# Patient Record
Sex: Female | Born: 1995 | Race: Black or African American | Hispanic: No | Marital: Single
Health system: Midwestern US, Community
[De-identification: ages and names within clinical notes are randomized; demographics above are authoritative.]

## PROBLEM LIST (undated history)

## (undated) ENCOUNTER — Emergency Department (HOSPITAL_COMMUNITY): Payer: Medicaid Other

## (undated) DIAGNOSIS — M25561 Pain in right knee: Principal | ICD-10-CM

## (undated) DIAGNOSIS — F419 Anxiety disorder, unspecified: Secondary | ICD-10-CM

## (undated) DIAGNOSIS — N6459 Other signs and symptoms in breast: Secondary | ICD-10-CM

## (undated) DIAGNOSIS — T753XXA Motion sickness, initial encounter: Secondary | ICD-10-CM

## (undated) DIAGNOSIS — D352 Benign neoplasm of pituitary gland: Secondary | ICD-10-CM

## (undated) HISTORY — PX: ANKLE SURGERY: SHX546

## (undated) HISTORY — DX: Anxiety disorder, unspecified: F41.9

---
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
## 2016-03-12 ENCOUNTER — Other Ambulatory Visit: Payer: Self-pay | Admitting: Specialist

## 2016-03-12 DIAGNOSIS — M25561 Pain in right knee: Secondary | ICD-10-CM

## 2016-03-31 ENCOUNTER — Ambulatory Visit
Admission: RE | Admit: 2016-03-31 | Discharge: 2016-03-31 | Disposition: A | Discharge status: Home / Self Care | Payer: Medicaid Other | Source: Ambulatory Visit | Attending: Specialist | Admitting: Specialist

## 2016-03-31 DIAGNOSIS — M25561 Pain in right knee: Secondary | ICD-10-CM | POA: Diagnosis not present

## 2016-06-26 ENCOUNTER — Emergency Department: Payer: Medicaid Other

## 2016-06-26 ENCOUNTER — Other Ambulatory Visit: Payer: Self-pay

## 2016-06-26 ENCOUNTER — Emergency Department
Admission: EM | Admit: 2016-06-26 | Discharge: 2016-06-26 | Disposition: A | Discharge status: Home / Self Care | Payer: Medicaid Other | Attending: Emergency Medicine | Admitting: Emergency Medicine

## 2016-06-26 DIAGNOSIS — R072 Precordial pain: Secondary | ICD-10-CM | POA: Diagnosis not present

## 2016-06-26 DIAGNOSIS — R079 Chest pain, unspecified: Secondary | ICD-10-CM

## 2016-06-26 LAB — BASIC METABOLIC PANEL
Anion gap: 10 (ref 5–15)
BUN: 9 mg/dL (ref 6–20)
CHLORIDE: 105 mmol/L (ref 101–111)
CO2: 24 mmol/L (ref 22–32)
CREATININE: 0.74 mg/dL (ref 0.44–1.00)
Calcium: 10.1 mg/dL (ref 8.9–10.3)
Glucose, Bld: 87 mg/dL (ref 65–99)
POTASSIUM: 3.5 mmol/L (ref 3.5–5.1)
SODIUM: 139 mmol/L (ref 135–145)

## 2016-06-26 LAB — CBC
HCT: 37.6 % (ref 35.0–47.0)
Hemoglobin: 13.3 g/dL (ref 12.0–16.0)
MCH: 30.3 pg (ref 26.0–34.0)
MCHC: 35.5 g/dL (ref 32.0–36.0)
MCV: 85.3 fL (ref 80.0–100.0)
PLATELETS: 245 10*3/uL (ref 150–440)
RBC: 4.41 MIL/uL (ref 3.80–5.20)
RDW: 12.8 % (ref 11.5–14.5)
WBC: 4.3 10*3/uL (ref 3.6–11.0)

## 2016-06-26 LAB — FIBRIN DERIVATIVES D-DIMER (ARMC ONLY): FIBRIN DERIVATIVES D-DIMER (ARMC): 368 (ref 0–499)

## 2016-06-26 LAB — TROPONIN I: Troponin I: <0.03 ng/mL (ref <0.03)

## 2016-06-26 LAB — POCT PREGNANCY, URINE: PREG TEST UR: NEGATIVE

## 2016-06-26 NOTE — ED Triage Notes (Addendum)
Pt c/o sharp pain in the substernal chest that started last night, worse with deep breathing.. Denies injury or other sx..pt states she just started 3 new medication yesterday, abx for bacterial vaginosis, antidepressant, nausea meds.Ana Moran

## 2016-06-26 NOTE — ED Provider Notes (Signed)
[facility] Emergency Department Provider Note ____________________________________________  Time seen: Approximately 1pm I have reviewed the triage vital signs and the triage nursing note.  HISTORY  Chief Complaint Chest Pain   Historian Patient  HPI Ana Moran is a 20 y.o. female who is here for evaluation of chest pain that started last night, a central lesion located and is a little bit worse with breathing. Somewhat waxing and waning. No fever. No coughing. No sinus congestion or drainage. No abdominal pain, or vomiting. She does have some nausea but she takes nausea medication for this because it is a side effect of her depression medication. She reports she does struggle with anxiety, but does not feel like she had a specific panic attack. She does report being under some stress recently.  She denies shortness of breath, but does state that the chest pain is a little bit worse with deep inspiration. She is on birth control pills. Reports family history of blood clot, in family members with cancer. No additional risk factors for DVT or PE. No leg swelling or calf tenderness.    History reviewed. No pertinent past medical history.  There are no active problems to display for this patient.   Past Surgical History:  Procedure Laterality Date  . KNEE ARTHROSCOPY WITH MENISCAL REPAIR Left 11/14/2015   Procedure: KNEE ARTHROSCOPY WITH partial synovectomy;  Surgeon: Corky Mull, MD;  Location: [facility] ORS;  Service: Orthopedics;  Laterality: Left;    Prior to Admission medications   Medication Sig Start Date End Date Taking? Authorizing Provider  Cholecalciferol (VITAMIN D-3 PO) Take by mouth.    Historical Provider, MD  HYDROcodone-acetaminophen (NORCO) 5-325 MG tablet Take 1-2 tablets by mouth every 6 (six) hours as needed for moderate pain. MAXIMUM TOTAL ACETAMINOPHEN DOSE IS 4000 MG PER DAY 11/14/15   Corky Mull, MD  ibuprofen (ADVIL,MOTRIN) 100  MG tablet Take 6-8 tablets (600-800 mg total) by mouth every 6 (six) hours as needed for pain or fever. 11/14/15   Corky Mull, MD  ondansetron (ZOFRAN-ODT) 4 MG disintegrating tablet Take 1 tablet (4 mg total) by mouth every 8 (eight) hours as needed for nausea or vomiting. 11/30/15   Anne-Caroline Mariea Clonts, MD  PARoxetine (PAXIL) 10 MG tablet Take 10 mg by mouth daily.    Historical Provider, MD    Allergies  Allergen Reactions  . Strawberry (Diagnostic)     No family history on file.  Social History Social History  Substance Use Topics  . Smoking status: Never Smoker  . Smokeless tobacco: Never Used  . Alcohol use No    Review of Systems   Constitutional: Negative for fever. Eyes: Reports occasional specks floating in her vision. Not now. ENT: Negative for sore throat.  Cardiovascular: Negative for palpitations. Respiratory: Negative for shortness of breath. Gastrointestinal: Negative for abdominal pain, vomiting and diarrhea. Genitourinary: Negative for dysuria. Musculoskeletal: Negative for back pain. Skin: Negative for rash. Neurological: Negative for headache. 10 point Review of Systems otherwise negative ____________________________________________   PHYSICAL EXAM:  VITAL SIGNS: ED Triage Vitals  Enc Vitals Group     BP 06/26/16 1147 134/76     Pulse Rate 06/26/16 1147 87     Resp 06/26/16 1147 16     Temp 06/26/16 1147 98.6 F (37 C)     Temp Source 06/26/16 1147 Oral     SpO2 06/26/16 1147 100 %     Weight 06/26/16 1147 127 lb (57.6 kg)  Height 06/26/16 1147 5\' 4"  (1.626 m)     Head Circumference --      Peak Flow --      Pain Score 06/26/16 1149 9     Pain Loc --      Pain Edu? --      Excl. in [place]? --      Constitutional: Alert and oriented. Well appearing and in no distress. HEENT   Head: Normocephalic and atraumatic.      Eyes: Conjunctivae are normal. PERRL. Normal extraocular movements.  Funduscopic exam normal bilaterally.      Ears:          Nose: No congestion/rhinnorhea.   Mouth/Throat: Mucous membranes are moist.   Neck: No stridor. Cardiovascular/Chest: Normal rate, regular rhythm.  No murmurs, rubs, or gallops. Respiratory: Normal respiratory effort without tachypnea nor retractions. Breath sounds are clear and equal bilaterally. No wheezes/rales/rhonchi. Gastrointestinal: Soft. No distention, no guarding, no rebound. Nontender.    Genitourinary/rectal:Deferred Musculoskeletal: Nontender with normal range of motion in all extremities. No joint effusions.  No lower extremity tenderness.  No edema. Neurologic:  Normal speech and language. No gross or focal neurologic deficits are appreciated. Skin:  Skin is warm, dry and intact. No rash noted. Psychiatric: Mood and affect are normal. Speech and behavior are normal. Patient exhibits appropriate insight and judgment.  ____________________________________________   EKG I, Lisa Roca, MD, the attending physician have personally viewed and interpreted all ECGs.  104 bpm. Sinus tachycardia. Narrow QRS. Nonspecific ST and T-wave. QTc 470. ____________________________________________  LABS (pertinent positives/negatives)  Labs Reviewed  BASIC METABOLIC PANEL  CBC  TROPONIN I  FIBRIN DERIVATIVES D-DIMER ([facility] ONLY)  POCT PREGNANCY, URINE    ____________________________________________  RADIOLOGY All Xrays were viewed by me. Imaging interpreted by Radiologist.  Chest two-view: Negative two-view chest x-ray __________________________________________  PROCEDURES  Procedure(s) performed: None  Critical Care performed: None  ____________________________________________   ED COURSE / ASSESSMENT AND PLAN  Pertinent labs & imaging results that were available during my care of the patient were reviewed by me and considered in my medical decision making (see chart for details).   This young healthy patient's symptoms are nonspecific, and I think that  with normal exam, 100% oxygenation, and reassuring laboratory studies and x-ray, I am most suspicious that her symptoms may be stress related.  However, in terms of possibility for PE, she did indicate some pleuritic nature to the chest discomfort, and she is on birth control pills, and she had her initial EKG showed heart rate of 104, and so I discussed with her obtaining a d-dimer for further stratification.    Again, I have extremely low suspicion for PE clinically by her description of symptoms, and D-dimer is below threshold.  I have discussed return precautions with patient and mom.    CONSULTATIONS:   None   Patient / Family / Caregiver informed of clinical course, medical decision-making process, and agree with plan.   I discussed return precautions, follow-up instructions, and discharged instructions with patient and/or family.   ___________________________________________   FINAL CLINICAL IMPRESSION(S) / ED DIAGNOSES   Final diagnoses:  Nonspecific chest pain              Note: This dictation was prepared with Dragon dictation. Any transcriptional errors that result from this process are unintentional    Lisa Roca, MD 06/26/16 1454

## 2016-06-26 NOTE — Discharge Instructions (Signed)
You were evaluated for chest discomfort, and although no certain cause was found, your exam and evaluation are reassuring in the emergency department today. Return to emergency department for any worsening condition including trouble breathing, sweating, dizziness, passing out, palpitation, fever, or any other symptoms concerning to you. Please follow-up with a primary care doctor.

## 2016-10-12 ENCOUNTER — Other Ambulatory Visit: Payer: Self-pay | Admitting: Family Medicine

## 2016-10-12 DIAGNOSIS — N644 Mastodynia: Secondary | ICD-10-CM

## 2016-10-29 ENCOUNTER — Ambulatory Visit
Admission: RE | Admit: 2016-10-29 | Discharge: 2016-10-29 | Disposition: A | Discharge status: Home / Self Care | Payer: Medicaid Other | Source: Ambulatory Visit | Attending: Family Medicine | Admitting: Family Medicine

## 2016-10-29 DIAGNOSIS — N644 Mastodynia: Secondary | ICD-10-CM

## 2016-10-29 HISTORY — DX: Other signs and symptoms in breast: N64.59

## 2016-12-22 IMAGING — MR MR KNEE*R* W/O CM
6 series · 38 of 40 positions shown · non-contrast
Comparison: None.

CLINICAL DATA: Right knee pain and swelling, chronic. No known
injury. Initial encounter.

EXAM:
MRI OF THE RIGHT KNEE WITHOUT CONTRAST
TECHNIQUE: Multiplanar, multisequence MR imaging of the knee was performed. No
intravenous contrast was administered.

[Series 3: PD fat-sat · axial · 3.0mm · 0.50mm/px · z∈[-67,+45]mm · 8 of 35 slices shown (1 of 4)]
[im 1/35]
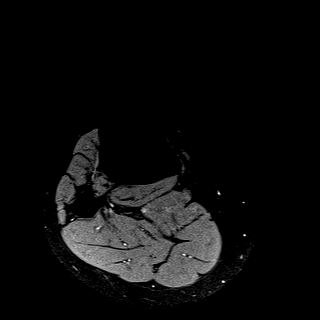
[im 5/35]
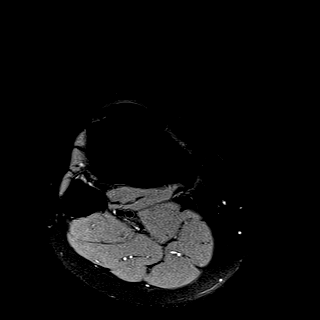
[im 10/35]
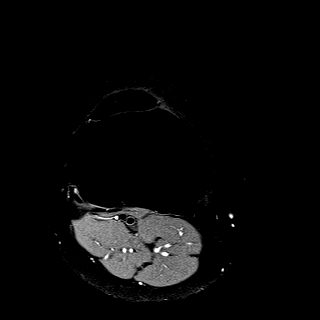
[im 15/35]
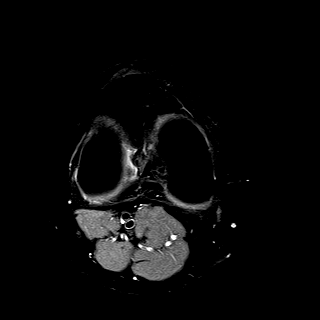
[im 20/35]
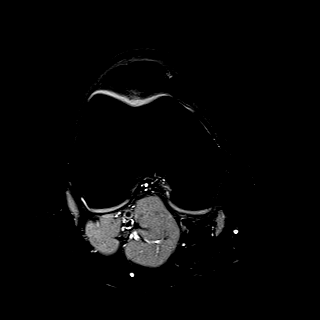
[im 25/35]
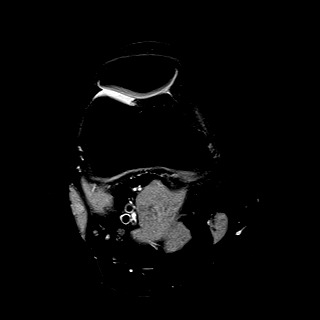
[im 30/35]
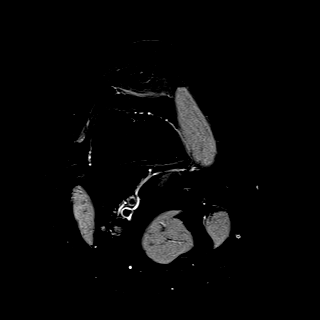
[im 35/35]
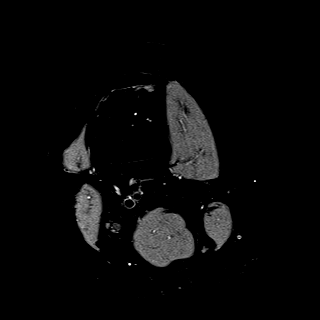

[Series 4: T1 · coronal · 3.0mm · 0.50mm/px · 5 of 29 slices shown]
[im 1/29]
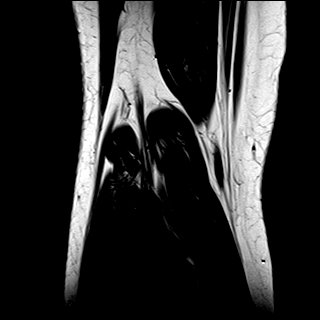
[im 5/29]
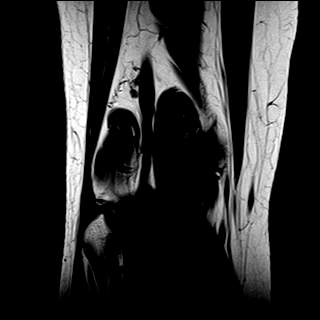
[im 10/29]
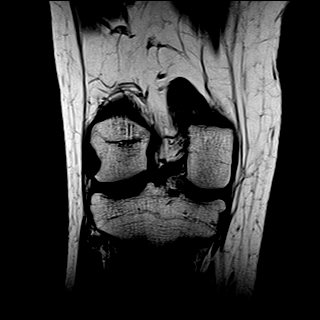
[im 15/29]
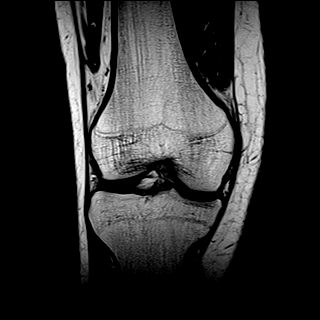
[im 19/29]
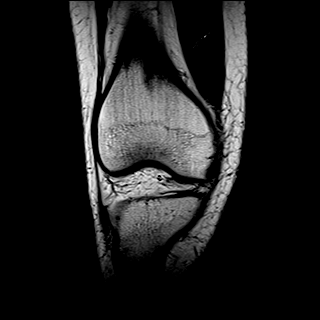

[Series 5: PD fat-sat · sagittal · 3.0mm · 0.50mm/px · 8 of 31 slices shown (2 of 4)]
[im 1/31]
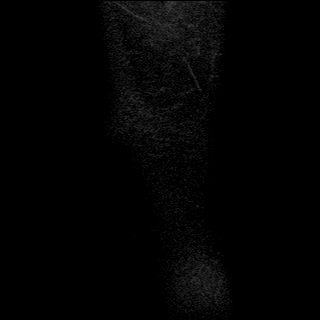
[im 5/31]
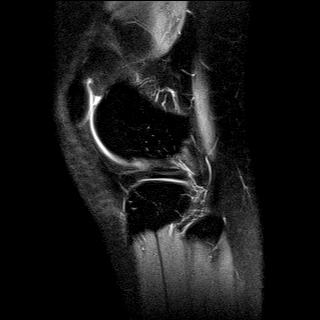
[im 9/31]
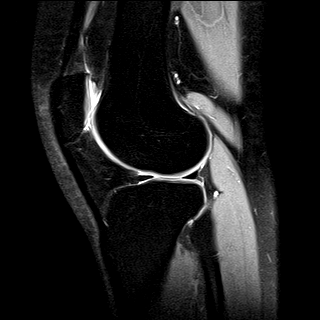
[im 13/31]
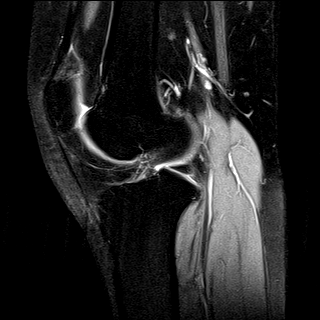
[im 18/31]
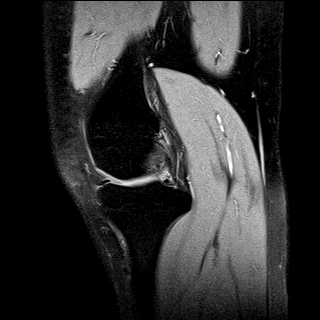
[im 22/31]
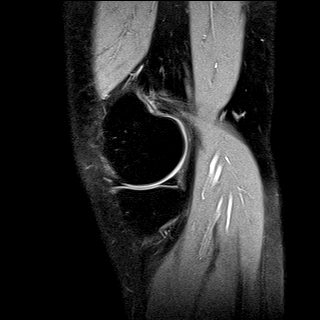
[im 26/31]
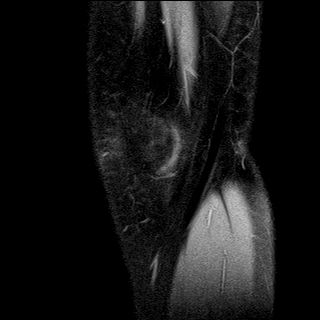
[im 31/31]
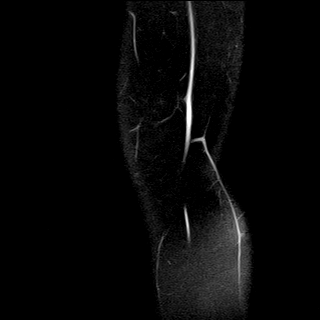

[Series 6: T2 fat-sat · coronal · 3.0mm · 0.31mm/px · 7 of 29 slices shown]
[im 1/29]
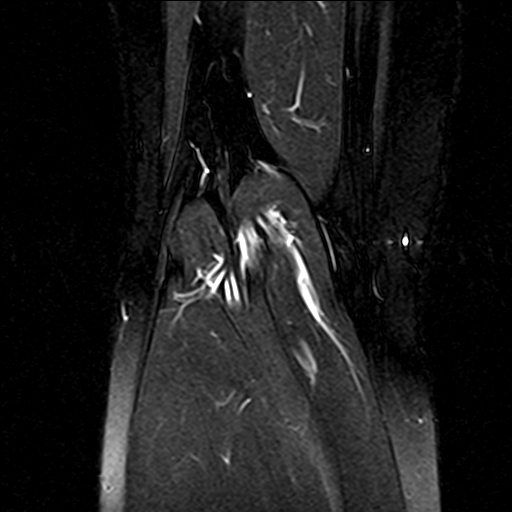
[im 5/29]
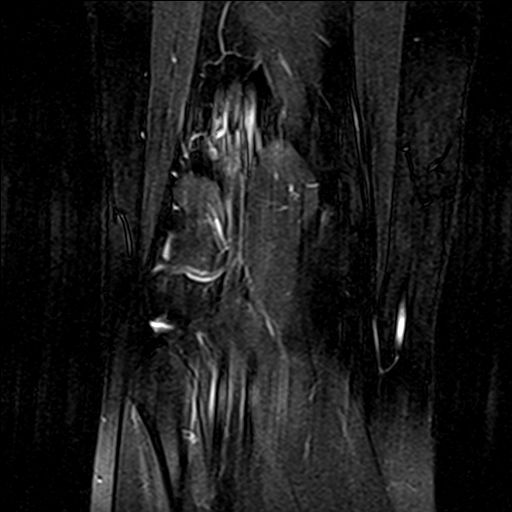
[im 10/29]
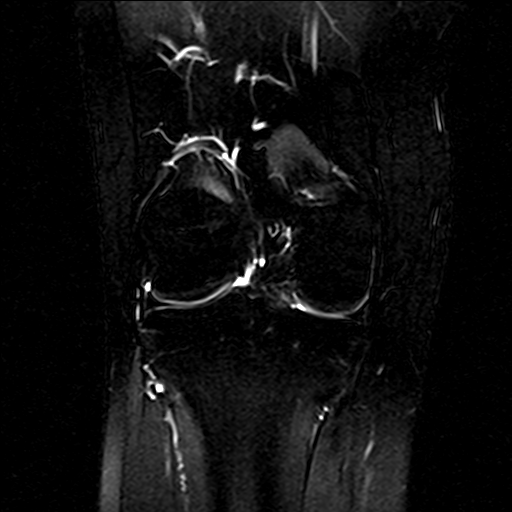
[im 15/29]
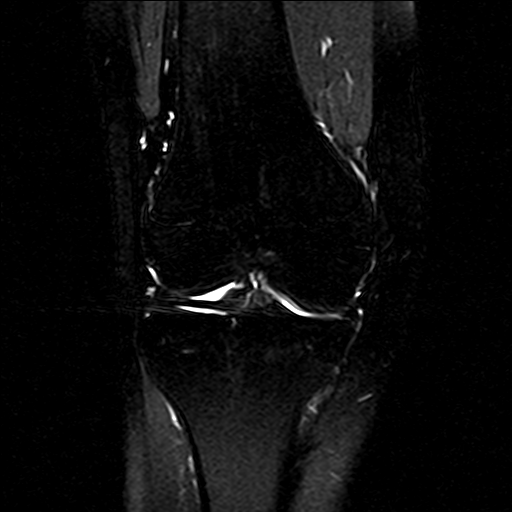
[im 19/29]
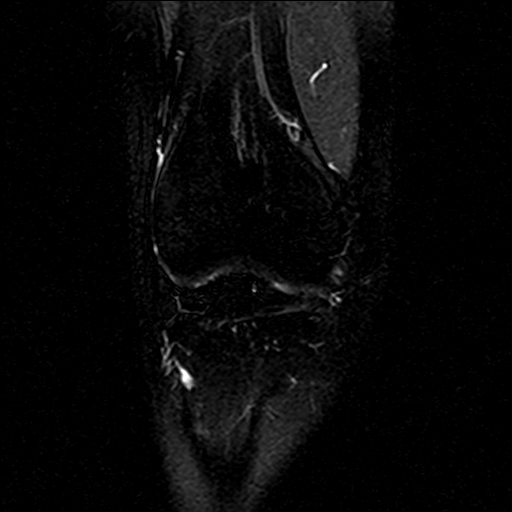
[im 24/29]
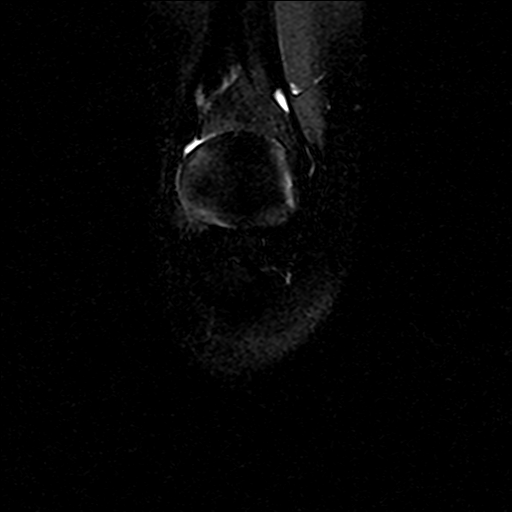
[im 29/29]
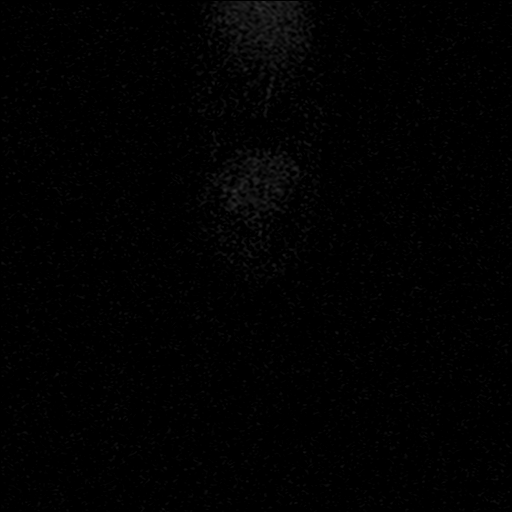

[Series 7: PD fat-sat · coronal · 3.0mm · 0.50mm/px · 7 of 29 slices shown (3 of 4)]
[im 1/29]
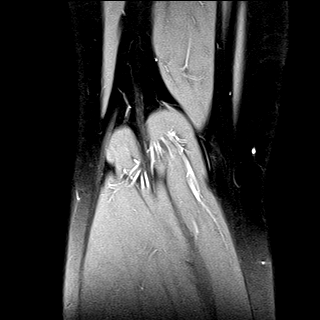
[im 5/29]
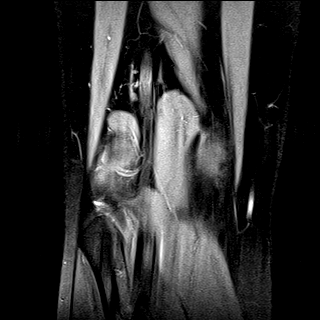
[im 10/29]
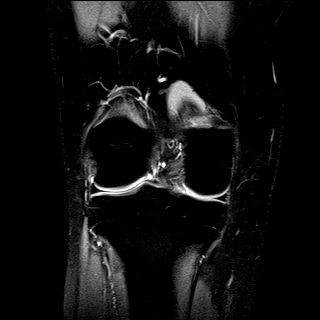
[im 15/29]
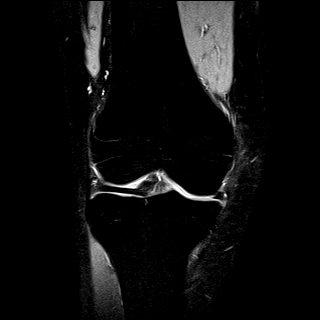
[im 19/29]
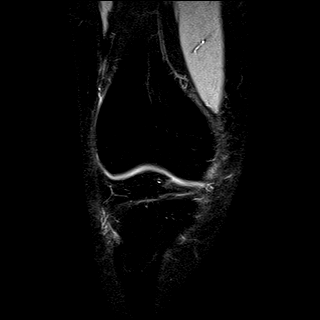
[im 24/29]
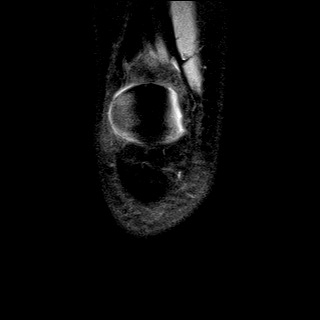
[im 29/29]
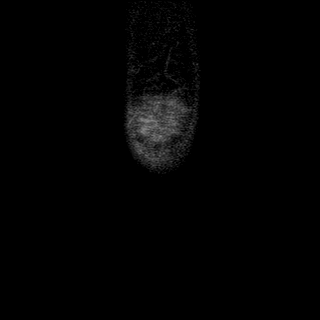

[Series 8: PD fat-sat · oblique · 2.0mm · 0.62mm/px · 3 of 11 slices shown (4 of 4)]
[im 1/11]
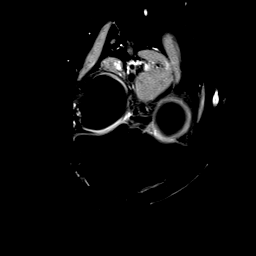
[im 6/11]
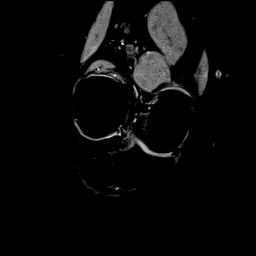
[im 11/11]
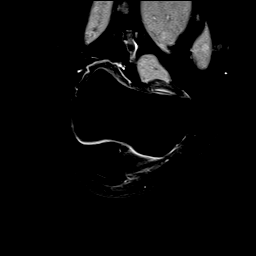

[38 of 40 positions shown; findings below may reference images not displayed]

FINDINGS: MENISCI

Medial meniscus:  Intact.

Lateral meniscus:  Intact.

LIGAMENTS

Cruciates:  Intact.

Collaterals:  Intact.

CARTILAGE

Patellofemoral:  Unremarkable.

Medial:  Unremarkable.

Lateral:  Unremarkable.

Joint:  No effusion.

Popliteal Fossa:  No Baker's cyst.

Extensor Mechanism:  Intact.

Bones:  Normal marrow signal throughout.
IMPRESSION: Normal MRI right knee.

## 2017-03-23 ENCOUNTER — Other Ambulatory Visit (HOSPITAL_COMMUNITY): Payer: Self-pay | Admitting: Physician Assistant

## 2017-03-23 ENCOUNTER — Other Ambulatory Visit: Payer: Self-pay | Admitting: Physician Assistant

## 2017-03-23 DIAGNOSIS — M25561 Pain in right knee: Secondary | ICD-10-CM

## 2017-04-05 ENCOUNTER — Ambulatory Visit
Admission: RE | Admit: 2017-04-05 | Discharge: 2017-04-05 | Disposition: A | Discharge status: Home / Self Care | Payer: Medicaid Other | Source: Ambulatory Visit | Attending: Physician Assistant | Admitting: Physician Assistant

## 2017-04-05 DIAGNOSIS — M25561 Pain in right knee: Secondary | ICD-10-CM | POA: Diagnosis present

## 2017-10-01 ENCOUNTER — Other Ambulatory Visit: Payer: Self-pay | Admitting: Sports Medicine

## 2017-10-01 DIAGNOSIS — G8929 Other chronic pain: Secondary | ICD-10-CM

## 2017-10-01 DIAGNOSIS — M25572 Pain in left ankle and joints of left foot: Secondary | ICD-10-CM

## 2017-10-01 DIAGNOSIS — M67472 Ganglion, left ankle and foot: Secondary | ICD-10-CM

## 2017-10-09 ENCOUNTER — Ambulatory Visit
Admission: RE | Admit: 2017-10-09 | Discharge: 2017-10-09 | Disposition: A | Discharge status: Home / Self Care | Payer: Medicaid Other | Source: Ambulatory Visit | Attending: Sports Medicine | Admitting: Sports Medicine

## 2017-10-09 DIAGNOSIS — G8929 Other chronic pain: Secondary | ICD-10-CM | POA: Diagnosis not present

## 2017-10-09 DIAGNOSIS — M25572 Pain in left ankle and joints of left foot: Secondary | ICD-10-CM | POA: Diagnosis not present

## 2017-10-09 DIAGNOSIS — M899 Disorder of bone, unspecified: Secondary | ICD-10-CM | POA: Insufficient documentation

## 2017-10-09 DIAGNOSIS — M659 Synovitis and tenosynovitis, unspecified: Secondary | ICD-10-CM | POA: Insufficient documentation

## 2017-10-09 DIAGNOSIS — M67472 Ganglion, left ankle and foot: Secondary | ICD-10-CM | POA: Diagnosis present

## 2017-10-23 ENCOUNTER — Encounter: Payer: Self-pay | Admitting: Emergency Medicine

## 2017-10-23 ENCOUNTER — Emergency Department
Admission: EM | Admit: 2017-10-23 | Discharge: 2017-10-23 | Disposition: A | Discharge status: Home / Self Care | Payer: Medicaid Other | Attending: Emergency Medicine | Admitting: Emergency Medicine

## 2017-10-23 ENCOUNTER — Emergency Department: Payer: Medicaid Other

## 2017-10-23 ENCOUNTER — Other Ambulatory Visit: Payer: Self-pay

## 2017-10-23 DIAGNOSIS — R0789 Other chest pain: Secondary | ICD-10-CM | POA: Diagnosis not present

## 2017-10-23 DIAGNOSIS — Z79899 Other long term (current) drug therapy: Secondary | ICD-10-CM | POA: Diagnosis not present

## 2017-10-23 DIAGNOSIS — R079 Chest pain, unspecified: Secondary | ICD-10-CM | POA: Diagnosis present

## 2017-10-23 LAB — BASIC METABOLIC PANEL
Anion gap: 9 (ref 5–15)
BUN: 14 mg/dL (ref 6–20)
CHLORIDE: 102 mmol/L (ref 101–111)
CO2: 26 mmol/L (ref 22–32)
Calcium: 9.3 mg/dL (ref 8.9–10.3)
Creatinine, Ser: 0.8 mg/dL (ref 0.44–1.00)
GFR calc Af Amer: >60 mL/min (ref >60)
GFR calc non Af Amer: >60 mL/min (ref >60)
GLUCOSE: 77 mg/dL (ref 65–99)
Potassium: 3.5 mmol/L (ref 3.5–5.1)
Sodium: 137 mmol/L (ref 135–145)

## 2017-10-23 LAB — CBC
HEMATOCRIT: 37.8 % (ref 35.0–47.0)
Hemoglobin: 12.9 g/dL (ref 12.0–16.0)
MCH: 29.9 pg (ref 26.0–34.0)
MCHC: 34.1 g/dL (ref 32.0–36.0)
MCV: 87.6 fL (ref 80.0–100.0)
Platelets: 226 10*3/uL (ref 150–440)
RBC: 4.32 MIL/uL (ref 3.80–5.20)
RDW: 12.9 % (ref 11.5–14.5)
WBC: 4.5 10*3/uL (ref 3.6–11.0)

## 2017-10-23 LAB — TROPONIN I: Troponin I: <0.03 ng/mL (ref <0.03)

## 2017-10-23 LAB — FIBRIN DERIVATIVES D-DIMER (ARMC ONLY): Fibrin derivatives D-dimer (ARMC): 166.3 ng/mL (FEU) (ref 0.00–499.00)

## 2017-10-23 MED ORDER — ALBUTEROL SULFATE (2.5 MG/3ML) 0.083% IN NEBU
2.5000 mg | INHALATION_SOLUTION | Freq: Once | RESPIRATORY_TRACT | Status: AC
  Administered 2017-10-23: 2.5 mg via RESPIRATORY_TRACT
  Filled 2017-10-23: qty 3

## 2017-10-23 MED ORDER — FAMOTIDINE 20 MG PO TABS
20.0000 mg | ORAL_TABLET | Freq: Once | ORAL | Status: AC
  Administered 2017-10-23: 20 mg via ORAL
  Filled 2017-10-23: qty 1

## 2017-10-23 MED ORDER — ALBUTEROL SULFATE HFA 108 (90 BASE) MCG/ACT IN AERS: 2.0000 | INHALATION_SPRAY | Freq: Four times a day (QID) | RESPIRATORY_TRACT | 0 refills | Status: DC | PRN

## 2017-10-23 NOTE — ED Triage Notes (Signed)
Pt to ED via POV stating that she is having central chest pain and shortness of breath. Pt states that she was seen by her PCP yesterday who thought the pain was coming from reflux. Pt states that the pain has gotten worse today. Pt is in NAD at this time. Pt denies N/V or diaphoresis.

## 2017-10-23 NOTE — ED Provider Notes (Signed)
[facility] Emergency Department Provider Note ____________________________________________   First MD Initiated Contact with Patient 10/23/17 1643     (approximate)  I have reviewed the triage vital signs and the nursing notes.   HISTORY  Chief Complaint Chest Pain and Shortness of Breath    HPI Ana Moran is a 21 y.o. female with no significant past medical history who presents with chest pain, gradual onset over the last week, intermittent, nonexertional, and sometimes worse after eating.  Patient states that it also has occasionally been associated with some shortness of breath, especially over the last day.  Patient also reports nonproductive cough.  She denies fever, lightheadedness or weakness, nausea or vomiting, or abdominal pain.  Patient states she was seen by her doctor yesterday, was told the pain was likely due to GERD.  She was prescribed a medication, but states that she has not started yet.  Patient became concerned when she felt short of breath today.   Past Medical History:  Diagnosis Date  . Inverted nipple     There are no active problems to display for this patient.   Past Surgical History:  Procedure Laterality Date  . KNEE ARTHROSCOPY WITH MENISCAL REPAIR Left 11/14/2015   Procedure: KNEE ARTHROSCOPY WITH partial synovectomy;  Surgeon: Corky Mull, MD;  Location: [facility] ORS;  Service: Orthopedics;  Laterality: Left;    Prior to Admission medications   Medication Sig Start Date End Date Taking? Authorizing Provider  Cholecalciferol (VITAMIN D-3 PO) Take by mouth.    [provider]  HYDROcodone-acetaminophen (NORCO) 5-325 MG tablet Take 1-2 tablets by mouth every 6 (six) hours as needed for moderate pain. MAXIMUM TOTAL ACETAMINOPHEN DOSE IS 4000 MG PER DAY 11/14/15   Poggi, Marshall Cork, MD  ibuprofen (ADVIL,MOTRIN) 100 MG tablet Take 6-8 tablets (600-800 mg total) by mouth every 6 (six) hours as needed for pain or  fever. 11/14/15   Poggi, Marshall Cork, MD  ondansetron (ZOFRAN-ODT) 4 MG disintegrating tablet Take 1 tablet (4 mg total) by mouth every 8 (eight) hours as needed for nausea or vomiting. 11/30/15   Eula Listen, MD  PARoxetine (PAXIL) 10 MG tablet Take 10 mg by mouth daily.    [provider]    Allergies Strawberry (diagnostic)  Family History  Problem Relation Age of Onset  . Breast cancer Maternal Aunt     Social History Social History   Tobacco Use  . Smoking status: Never Smoker  . Smokeless tobacco: Never Used  Substance Use Topics  . Alcohol use: No  . Drug use: No    Review of Systems  Constitutional: No fever.  Eyes: No visual changes. ENT: No sore throat. Cardiovascular: Positive for chest pain. Respiratory: Positive for shortness of breath. Gastrointestinal: No nausea, no vomiting.   Genitourinary: Negative for flank pain.  Musculoskeletal: Negative for back pain. Skin: Negative for rash. Neurological: Negative for headache.    ____________________________________________   PHYSICAL EXAM:  VITAL SIGNS: ED Triage Vitals  Enc Vitals Group     BP 10/23/17 1621 121/63     Pulse Rate 10/23/17 1621 78     Resp 10/23/17 1621 16     Temp 10/23/17 1621 98.5 F (36.9 C)     Temp Source 10/23/17 1621 Oral     SpO2 10/23/17 1621 100 %     Weight 10/23/17 1621 127 lb (57.6 kg)     Height --      Head Circumference --  Peak Flow --      Pain Score 10/23/17 1618 8     Pain Loc --      Pain Edu? --      Excl. in [place]? --     Constitutional: Alert and oriented. Well appearing and in no acute distress. Eyes: Conjunctivae are normal.  Head: Atraumatic. Nose: No congestion/rhinnorhea. Mouth/Throat: Mucous membranes are moist.   Neck: Normal range of motion.  Cardiovascular: Normal rate, regular rhythm. Grossly normal heart sounds.  Good peripheral circulation. Respiratory: Normal respiratory effort.  No retractions. Lungs  CTAB. Gastrointestinal: Soft and nontender. No distention.  Genitourinary: No CVA tenderness. Musculoskeletal: No lower extremity edema.  Extremities warm and well perfused.  No calf or popliteal swelling or tenderness. Neurologic:  Normal speech and language. No gross focal neurologic deficits are appreciated.  Skin:  Skin is warm and dry. No rash noted. Psychiatric: Mood and affect are normal. Speech and behavior are normal.  ____________________________________________   LABS (all labs ordered are listed, but only abnormal results are displayed)  Labs Reviewed  BASIC METABOLIC PANEL  CBC  TROPONIN I  FIBRIN DERIVATIVES D-DIMER ([facility] ONLY)  POC URINE PREG, ED   ____________________________________________  EKG  ED ECG REPORT I, Arta Silence, the attending physician, personally viewed and interpreted this ECG.  Date: 10/23/2017 EKG Time: 1618 Rate: 71 Rhythm: normal sinus rhythm QRS Axis: normal Intervals: normal ST/T Wave abnormalities: normal Narrative Interpretation: no evidence of acute ischemia  ____________________________________________  RADIOLOGY  CXR: no focal infiltrate or other acute findings  ____________________________________________   PROCEDURES  Procedure(s) performed: No    Critical Care performed: No ____________________________________________   INITIAL IMPRESSION / ASSESSMENT AND PLAN / ED COURSE  Pertinent labs & imaging results that were available during my care of the patient were reviewed by me and considered in my medical decision making (see chart for details).  21 year old female with no cardiac risk factors are significant past medical history presents with atypical and intermittent chest pain or last week, associated in the last day with some shortness of breath.  Patient also reports mild nonproductive cough.  On exam, patient is well-appearing, vital signs are normal, and there are no significant exam findings.  Her  EKG is normal.  On review of past medical records in Epic, patient was seen in the ED in August 2017 for chest pain and evaluated including labs and d-dimer which were negative.  Differential includes primarily GERD, versus musculoskeletal pain, versus bronchitis given the shortness of breath.  Patient has no cardiac risk factors, and there is no evidence of ACS.  She also has no specific direct risk factors for PE and no signs or symptoms of DVT, however given the presence of the shortness of breath I will obtain a d-dimer in addition to troponin x1, chest x-ray, and basic labs.    ----------------------------------------- 6:53 PM on 10/23/2017 -----------------------------------------  Patient feels significantly better after the nebulizer treatment and the Pepcid.  She would like to go home.  Her workup is negative.  I suspect mostly likely bronchitis, but this could also be due to GERD.  Patient was prescribed medication by her doctor for GERD and states she will start it when she picks it up on Monday.  I will also prescribe the patient for inhaler if she has recurrent shortness of breath.  Return precautions given, and patient expresses understanding.  ____________________________________________   FINAL CLINICAL IMPRESSION(S) / ED DIAGNOSES  Final diagnoses:  Atypical chest pain  NEW MEDICATIONS STARTED DURING THIS VISIT:  This SmartLink is deprecated. Use AVSMEDLIST instead to display the medication list for a patient.   Note:  This document was prepared using Dragon voice recognition software and may include unintentional dictation errors.    Arta Silence, MD 10/23/17 [phone]

## 2017-10-23 NOTE — Discharge Instructions (Signed)
Return to the ER for new or worsening chest pain, difficulty breathing, lightheadedness or weakness, fevers, or any other new or worsening symptoms that concern you.  You should take the medication for acid reflux that was prescribed by your doctor.  We have also prescribed an inhaler to take if you have any recurrence of your shortness of breath.

## 2017-11-11 ENCOUNTER — Other Ambulatory Visit: Payer: Self-pay

## 2017-11-11 ENCOUNTER — Encounter: Payer: Self-pay | Admitting: *Deleted

## 2017-11-24 ENCOUNTER — Encounter
Admission: RE | Disposition: A | Discharge status: Home / Self Care | Payer: Self-pay | Source: Ambulatory Visit | Attending: Surgery

## 2017-11-24 ENCOUNTER — Ambulatory Visit: Payer: Medicaid Other | Admitting: Student in an Organized Health Care Education/Training Program

## 2017-11-24 ENCOUNTER — Ambulatory Visit
Admission: RE | Admit: 2017-11-24 | Discharge: 2017-11-24 | Disposition: A | Discharge status: Home / Self Care | Payer: Medicaid Other | Source: Ambulatory Visit | Attending: Surgery | Admitting: Surgery

## 2017-11-24 DIAGNOSIS — K219 Gastro-esophageal reflux disease without esophagitis: Secondary | ICD-10-CM | POA: Insufficient documentation

## 2017-11-24 DIAGNOSIS — M67472 Ganglion, left ankle and foot: Secondary | ICD-10-CM | POA: Diagnosis present

## 2017-11-24 DIAGNOSIS — Z79899 Other long term (current) drug therapy: Secondary | ICD-10-CM | POA: Diagnosis not present

## 2017-11-24 HISTORY — PX: GANGLION CYST EXCISION: SHX1691

## 2017-11-24 HISTORY — DX: Motion sickness, initial encounter: T75.3XXA

## 2017-11-24 SURGERY — EXCISION, GANGLION CYST, FOOT
Anesthesia: General | Laterality: Left | Wound class: Clean

## 2017-11-24 MED ORDER — LACTATED RINGERS IV SOLN
INTRAVENOUS | Status: DC
  Administered 2017-11-24: 13:00:00 via INTRAVENOUS

## 2017-11-24 MED ORDER — OXYCODONE HCL 5 MG PO TABS
5.0000 mg | ORAL_TABLET | Freq: Once | ORAL | Status: AC | PRN
  Administered 2017-11-24: 5 mg via ORAL

## 2017-11-24 MED ORDER — CEFAZOLIN SODIUM-DEXTROSE 2-4 GM/100ML-% IV SOLN
2.0000 g | Freq: Once | INTRAVENOUS | Status: AC
  Administered 2017-11-24: 2 g via INTRAVENOUS

## 2017-11-24 MED ORDER — OXYCODONE HCL 5 MG/5ML PO SOLN: 5.0000 mg | Freq: Once | ORAL | Status: AC | PRN

## 2017-11-24 MED ORDER — ONDANSETRON HCL 4 MG/2ML IJ SOLN: 4.0000 mg | Freq: Once | INTRAMUSCULAR | Status: DC | PRN

## 2017-11-24 MED ORDER — LACTATED RINGERS IV SOLN: INTRAVENOUS | Status: DC

## 2017-11-24 MED ORDER — ONDANSETRON HCL 4 MG/2ML IJ SOLN
INTRAMUSCULAR | Status: DC | PRN
  Administered 2017-11-24: 4 mg via INTRAVENOUS

## 2017-11-24 MED ORDER — PROPOFOL 10 MG/ML IV BOLUS
INTRAVENOUS | Status: DC | PRN
  Administered 2017-11-24: 150 mg via INTRAVENOUS

## 2017-11-24 MED ORDER — GLYCOPYRROLATE 0.2 MG/ML IJ SOLN
INTRAMUSCULAR | Status: DC | PRN
  Administered 2017-11-24: 0.1 mg via INTRAVENOUS

## 2017-11-24 MED ORDER — LIDOCAINE HCL (CARDIAC) 20 MG/ML IV SOLN
INTRAVENOUS | Status: DC | PRN
  Administered 2017-11-24: 30 mg via INTRATRACHEAL

## 2017-11-24 MED ORDER — DEXAMETHASONE SODIUM PHOSPHATE 4 MG/ML IJ SOLN
INTRAMUSCULAR | Status: DC | PRN
  Administered 2017-11-24: 4 mg via INTRAVENOUS

## 2017-11-24 MED ORDER — MIDAZOLAM HCL 5 MG/5ML IJ SOLN
INTRAMUSCULAR | Status: DC | PRN
  Administered 2017-11-24: 2 mg via INTRAVENOUS

## 2017-11-24 MED ORDER — SCOPOLAMINE 1 MG/3DAYS TD PT72
1.0000 | MEDICATED_PATCH | Freq: Once | TRANSDERMAL | Status: DC
  Administered 2017-11-24: 1.5 mg via TRANSDERMAL

## 2017-11-24 MED ORDER — HYDROCODONE-ACETAMINOPHEN 5-325 MG PO TABS: 1.0000 | ORAL_TABLET | Freq: Four times a day (QID) | ORAL | 0 refills | Status: DC | PRN

## 2017-11-24 MED ORDER — BUPIVACAINE HCL (PF) 0.5 % IJ SOLN
INTRAMUSCULAR | Status: DC | PRN
  Administered 2017-11-24: 10 mL

## 2017-11-24 MED ORDER — FENTANYL CITRATE (PF) 100 MCG/2ML IJ SOLN: 25.0000 ug | INTRAMUSCULAR | Status: DC | PRN

## 2017-11-24 MED ORDER — FENTANYL CITRATE (PF) 100 MCG/2ML IJ SOLN
INTRAMUSCULAR | Status: DC | PRN
  Administered 2017-11-24 (×2): 25 ug via INTRAVENOUS
  Administered 2017-11-24: 50 ug via INTRAVENOUS
  Administered 2017-11-24: 25 ug via INTRAVENOUS

## 2017-11-24 MED ORDER — ACETAMINOPHEN 10 MG/ML IV SOLN: 1000.0000 mg | Freq: Once | INTRAVENOUS | Status: DC | PRN

## 2017-11-24 SURGICAL SUPPLY — 28 items
BANDAGE ELASTIC 2 LF NS (GAUZE/BANDAGES/DRESSINGS) ×3 IMPLANT
BANDAGE ELASTIC 3 LF NS (GAUZE/BANDAGES/DRESSINGS) IMPLANT
BENZOIN TINCTURE PRP APPL 2/3 (GAUZE/BANDAGES/DRESSINGS) IMPLANT
BNDG COHESIVE 4X5 TAN STRL (GAUZE/BANDAGES/DRESSINGS) ×3 IMPLANT
BNDG ESMARK 4X12 TAN STRL LF (GAUZE/BANDAGES/DRESSINGS) ×3 IMPLANT
CHLORAPREP W/TINT 26ML (MISCELLANEOUS) ×3 IMPLANT
CLOSURE WOUND 1/4X4 (GAUZE/BANDAGES/DRESSINGS)
CORD BIP STRL DISP 12FT (MISCELLANEOUS) ×3 IMPLANT
COVER LIGHT HANDLE UNIVERSAL (MISCELLANEOUS) ×6 IMPLANT
CUFF TOURNIQUET DUAL PORT 18X3 (MISCELLANEOUS) ×3 IMPLANT
DRAPE SURG 17X11 SM STRL (DRAPES) ×3 IMPLANT
GAUZE PETRO XEROFOAM 1X8 (MISCELLANEOUS) ×3 IMPLANT
GAUZE SPONGE 4X4 12PLY STRL (GAUZE/BANDAGES/DRESSINGS) ×3 IMPLANT
GLOVE BIO SURGEON STRL SZ8 (GLOVE) ×6 IMPLANT
GLOVE INDICATOR 8.0 STRL GRN (GLOVE) ×3 IMPLANT
GOWN STRL REUS W/ TWL LRG LVL3 (GOWN DISPOSABLE) ×1 IMPLANT
GOWN STRL REUS W/ TWL XL LVL3 (GOWN DISPOSABLE) ×1 IMPLANT
GOWN STRL REUS W/TWL LRG LVL3 (GOWN DISPOSABLE) ×2
GOWN STRL REUS W/TWL XL LVL3 (GOWN DISPOSABLE) ×2
KIT ROOM TURNOVER OR (KITS) ×3 IMPLANT
NS IRRIG 500ML POUR BTL (IV SOLUTION) ×3 IMPLANT
PACK EXTREMITY ARMC (MISCELLANEOUS) ×3 IMPLANT
STOCKINETTE IMPERVIOUS 9X36 MD (GAUZE/BANDAGES/DRESSINGS) ×3 IMPLANT
STRAP BODY AND KNEE 60X3 (MISCELLANEOUS) ×3 IMPLANT
STRIP CLOSURE SKIN 1/4X4 (GAUZE/BANDAGES/DRESSINGS) IMPLANT
SUT PROLENE 4 0 PS 2 18 (SUTURE) ×3 IMPLANT
SUT VIC AB 3-0 SH 27 (SUTURE)
SUT VIC AB 3-0 SH 27X BRD (SUTURE) IMPLANT

## 2017-11-24 NOTE — Transfer of Care (Signed)
Immediate Anesthesia Transfer of Care Note  Patient: Ana Moran  Procedure(s) Performed: REMOVAL GANGLION CYST ANKLE (Left )  Patient Location: PACU  Anesthesia Type: General  Level of Consciousness: awake, alert  and patient cooperative  Airway and Oxygen Therapy: Patient Spontanous Breathing and Patient connected to supplemental oxygen  Post-op Assessment: Post-op Vital signs reviewed, Patient's Cardiovascular Status Stable, Respiratory Function Stable, Patent Airway and No signs of Nausea or vomiting  Post-op Vital Signs: Reviewed and stable  Complications: No apparent anesthesia complications

## 2017-11-24 NOTE — Discharge Instructions (Addendum)
General Anesthesia, Adult, Care After These instructions provide you with information about caring for yourself after your procedure. Your health care provider may also give you more specific instructions. Your treatment has been planned according to current medical practices, but problems sometimes occur. Call your health care provider if you have any problems or questions after your procedure. What can I expect after the procedure? After the procedure, it is common to have:  Vomiting.  A sore throat.  Mental slowness.  It is common to feel:  Nauseous.  Cold or shivery.  Sleepy.  Tired.  Sore or achy, even in parts of your body where you did not have surgery.  Follow these instructions at home: For at least 24 hours after the procedure:  Do not: ? Participate in activities where you could fall or become injured. ? Drive. ? Use heavy machinery. ? Drink alcohol. ? Take sleeping pills or medicines that cause drowsiness. ? Make important decisions or sign legal documents. ? Take care of children on your own.  Rest. Eating and drinking  If you vomit, drink water, juice, or soup when you can drink without vomiting.  Drink enough fluid to keep your urine clear or pale yellow.  Make sure you have little or no nausea before eating solid foods.  Follow the diet recommended by your health care provider. General instructions  Have a responsible adult stay with you until you are awake and alert.  Return to your normal activities as told by your health care provider. Ask your health care provider what activities are safe for you.  Take over-the-counter and prescription medicines only as told by your health care provider.  If you smoke, do not smoke without supervision.  Keep all follow-up visits as told by your health care provider. This is important. Contact a health care provider if:  You continue to have nausea or vomiting at home, and medicines are not helpful.  You  cannot drink fluids or start eating again.  You cannot urinate after 8-12 hours.  You develop a skin rash.  You have fever.  You have increasing redness at the site of your procedure. Get help right away if:  You have difficulty breathing.  You have chest pain.  You have unexpected bleeding.  You feel that you are having a life-threatening or urgent problem. This information is not intended to replace advice given to you by your health care provider. Make sure you discuss any questions you have with your health care provider. Document Released: 02/15/2001 Document Revised: 04/13/2016 Document Reviewed: 10/24/2015 Elsevier Interactive Patient Education  2018 [employer].   Keep dressing dry and intact.  May shower after dressing changed on post-op day #4 (Sunday).  Cover staples/sutures with Band-Aids after drying off. Apply ice frequently to ankle. Take ibuprofen 600 mg TID with meals for 7-10 days, then as necessary. Take pain medication as prescribed or ES Tylenol when needed.  May weight-bear as tolerated - use crutches or walker as needed. Follow-up in 10-14 days or as scheduled.

## 2017-11-24 NOTE — H&P (Signed)
Paper H&P to be scanned into permanent record. H&P reviewed and patient re-examined. No changes. 

## 2017-11-24 NOTE — Anesthesia Preprocedure Evaluation (Signed)
Anesthesia Evaluation  Patient identified by MRN, date of birth, ID band Patient awake    Reviewed: Allergy & Precautions, NPO status , Patient's Chart, lab work & pertinent test results  History of Anesthesia Complications Negative for: history of anesthetic complications  Airway Mallampati: I  TM Distance: >3 FB Neck ROM: Full    Dental no notable dental hx.    Pulmonary neg pulmonary ROS,    Pulmonary exam normal breath sounds clear to auscultation       Cardiovascular Exercise Tolerance: Good negative cardio ROS Normal cardiovascular exam Rhythm:Regular Rate:Normal     Neuro/Psych negative neurological ROS     GI/Hepatic GERD  ,  Endo/Other  negative endocrine ROS  Renal/GU negative Renal ROS     Musculoskeletal   Abdominal   Peds  Hematology negative hematology ROS (+)   Anesthesia Other Findings Hx motion sickness  Reproductive/Obstetrics                             Anesthesia Physical Anesthesia Plan  ASA: II  Anesthesia Plan: General   Post-op Pain Management:    Induction: Intravenous  PONV Risk Score and Plan: 2 and Midazolam, Scopolamine patch - Pre-op and Ondansetron  Airway Management Planned: LMA  Additional Equipment:   Intra-op Plan:   Post-operative Plan: Extubation in OR  Informed Consent: I have reviewed the patients History and Physical, chart, labs and discussed the procedure including the risks, benefits and alternatives for the proposed anesthesia with the patient or authorized representative who has indicated his/her understanding and acceptance.     Plan Discussed with: CRNA  Anesthesia Plan Comments:         Anesthesia Quick Evaluation

## 2017-11-24 NOTE — Anesthesia Procedure Notes (Signed)
Procedure Name: LMA Insertion Date/Time: 11/24/2017 1:10 PM Performed by: Cameron Ali, CRNA Pre-anesthesia Checklist: Patient identified, Emergency Drugs available, Suction available, Timeout performed and Patient being monitored Patient Re-evaluated:Patient Re-evaluated prior to induction Oxygen Delivery Method: Circle system utilized Preoxygenation: Pre-oxygenation with 100% oxygen Induction Type: IV induction LMA: LMA inserted LMA Size: 4.0 Number of attempts: 1 Placement Confirmation: positive ETCO2 and breath sounds checked- equal and bilateral Tube secured with: Tape

## 2017-11-24 NOTE — Anesthesia Postprocedure Evaluation (Signed)
Anesthesia Post Note  Patient: Ana Moran  Procedure(s) Performed: REMOVAL GANGLION CYST ANKLE (Left )  Patient location during evaluation: PACU Anesthesia Type: General Level of consciousness: awake and alert, oriented and patient cooperative Pain management: pain level controlled Vital Signs Assessment: post-procedure vital signs reviewed and stable Respiratory status: spontaneous breathing, nonlabored ventilation and respiratory function stable Cardiovascular status: blood pressure returned to baseline and stable Postop Assessment: adequate PO intake Anesthetic complications: no    Darrin Nipper

## 2017-11-24 NOTE — Op Note (Signed)
11/24/2017  1:50 PM  Patient:   Ana Moran  Pre-Op Diagnosis:   Ganglion cyst, left ankle.  Post-Op Diagnosis:   Same.   Procedure:   Excision of ganglion cyst, left ankle.  Surgeon:   Pascal Lux, MD  Assistant:   None  Anesthesia:   General LMA  Findings:   As above.  Complications:   None  Fluids:   600 cc crystalloid  EBL:   None  TT:   20 minutes at 250 mmHg  Drains:   None  Closure:   4-0 Prolene interrupted sutures  Brief Clinical Note:   The patient is a 22 year old female with a several month history of medial sided left ankle pain.  Her history and examination were suspicious for a soft tissue mass in the medial aspect of the ankle just posterior to the medial malleolus.  A subsequent MRI scan confirmed the mass to be a ganglion cyst which appeared to be emanating from the flexor digitorum longus tendon sheath.  She presents at this time for excision of the ganglion cyst of the medial aspect of her left ankle.  Procedure:   The patient was brought into the operating room and laid in the supine position.  After adequate general laryngeal mask anesthesia was obtained, the left foot and lower leg were prepped with ChloraPrep solution before being draped sterilely.  Preoperative antibiotics were administered.  An approximately 3 cm curvilinear incision was made in line with the tarsal tunnel centered over the ganglion cyst along the medial aspect of her ankle.  The incision was carried out through the subcutaneous tissues.  The cyst was identified and dissected circumferentially with care taken to avoid injury to the neurovascular bundle just posterior to the cyst.  The cyst was removed in its entirety.  The cyst was punctured during removal, releasing a small amount of clear gelatinous material, consistent with a ganglion cyst.  The floor of the cyst was adjacent to and contiguous with the sheath of the flexor digitorum longus tendon so a small window was cut  into this sheath to minimize the likelihood of recurrence.  The wound was irrigated thoroughly with sterile saline solution before the subcutaneous tissues were closed using 2-0 Vicryl interrupted sutures.  The skin itself was closed using 4-0 Prolene interrupted sutures.  A total of 10 cc of 0.5% plain Sensorcaine was injected in and around the incision to help with postoperative analgesia before a sterile bulky dressing was applied to the ankle.  The patient was then awakened, extubated, and returned to the recovery room in satisfactory condition after tolerating the procedure well.

## 2018-01-03 ENCOUNTER — Emergency Department: Payer: Medicaid Other

## 2018-01-03 ENCOUNTER — Encounter: Payer: Self-pay | Admitting: Emergency Medicine

## 2018-01-03 ENCOUNTER — Emergency Department
Admission: EM | Admit: 2018-01-03 | Discharge: 2018-01-03 | Disposition: A | Discharge status: Home / Self Care | Payer: Medicaid Other | Attending: Emergency Medicine | Admitting: Emergency Medicine

## 2018-01-03 ENCOUNTER — Other Ambulatory Visit: Payer: Self-pay

## 2018-01-03 DIAGNOSIS — M79602 Pain in left arm: Secondary | ICD-10-CM | POA: Insufficient documentation

## 2018-01-03 DIAGNOSIS — Z79899 Other long term (current) drug therapy: Secondary | ICD-10-CM | POA: Diagnosis not present

## 2018-01-03 MED ORDER — NAPROXEN 500 MG PO TABS: 500.0000 mg | ORAL_TABLET | Freq: Two times a day (BID) | ORAL | 0 refills | Status: DC

## 2018-01-03 MED ORDER — NAPROXEN 500 MG PO TABS
500.0000 mg | ORAL_TABLET | Freq: Once | ORAL | Status: AC
  Administered 2018-01-03: 500 mg via ORAL
  Filled 2018-01-03: qty 1

## 2018-01-03 NOTE — ED Provider Notes (Signed)
[facility] Emergency Department Provider Note   ____________________________________________   First MD Initiated Contact with Patient 01/03/18 [phone]     (approximate)  I have reviewed the triage vital signs and the nursing notes.   HISTORY  Chief Complaint Arm Pain    HPI Ana Moran is a 22 y.o. female left arm pain secondary to fall yesterday.  Patient denies loss of sensation or loss of function to the left upper extremity.  Patient rates the pain as a 9/10.  Patient described the pain is "ache".  Patient states pain increases with extension and pronation of the left upper extremity.  Past Medical History:  Diagnosis Date  . Inverted nipple   . Motion sickness    cars    There are no active problems to display for this patient.   Past Surgical History:  Procedure Laterality Date  . ANKLE SURGERY    . GANGLION CYST EXCISION Left 11/24/2017   Procedure: REMOVAL GANGLION CYST ANKLE;  Surgeon: Corky Mull, MD;  Location: [place];  Service: Orthopedics;  Laterality: Left;  . KNEE ARTHROSCOPY WITH MENISCAL REPAIR Left 11/14/2015   Procedure: KNEE ARTHROSCOPY WITH partial synovectomy;  Surgeon: Corky Mull, MD;  Location: [facility] ORS;  Service: Orthopedics;  Laterality: Left;    Prior to Admission medications   Medication Sig Start Date End Date Taking? Authorizing Provider  albuterol (PROVENTIL HFA;VENTOLIN HFA) 108 (90 Base) MCG/ACT inhaler Inhale 2 puffs into the lungs every 6 (six) hours as needed for wheezing or shortness of breath. 10/23/17   Arta Silence, MD  HYDROcodone-acetaminophen (NORCO/VICODIN) 5-325 MG tablet Take 1-2 tablets by mouth every 6 (six) hours as needed for moderate pain. 11/24/17   Poggi, Marshall Cork, MD  naproxen (NAPROSYN) 500 MG tablet Take 1 tablet (500 mg total) by mouth 2 (two) times daily with a meal. 01/03/18   Sable Feil, PA-C  PARoxetine (PAXIL) 10 MG tablet Take 10 mg by mouth daily.     [provider]    Allergies Strawberry (diagnostic)  Family History  Problem Relation Age of Onset  . Breast cancer Maternal Aunt     Social History Social History   Tobacco Use  . Smoking status: Never Smoker  . Smokeless tobacco: Never Used  Substance Use Topics  . Alcohol use: No  . Drug use: No    Review of Systems Constitutional: No fever/chills Eyes: No visual changes. ENT: No sore throat. Cardiovascular: Denies chest pain. Respiratory: Denies shortness of breath. Gastrointestinal: No abdominal pain.  No nausea, no vomiting.  No diarrhea.  No constipation. Genitourinary: Negative for dysuria. Musculoskeletal: Left elbow pain Skin: Negative for rash. Neurological: Negative for headaches, focal weakness or numbness. Allergic/Immunilogical: Strawberries  ____________________________________________   PHYSICAL EXAM:  VITAL SIGNS: ED Triage Vitals  Enc Vitals Group     BP 01/03/18 0941 109/61     Pulse Rate 01/03/18 0941 77     Resp 01/03/18 0941 18     Temp 01/03/18 0941 98.3 F (36.8 C)     Temp Source 01/03/18 0941 Oral     SpO2 01/03/18 0941 98 %     Weight 01/03/18 0937 130 lb (59 kg)     Height 01/03/18 0937 5\' 4"  (1.626 m)     Head Circumference --      Peak Flow --      Pain Score 01/03/18 0937 9     Pain Loc --      Pain  Edu? --      Excl. in [place]? --    Constitutional: Alert and oriented. Well appearing and in no acute distress. Cardiovascular: Normal rate, regular rhythm. Grossly normal heart sounds.  Good peripheral circulation. Respiratory: Normal respiratory effort.  No retractions. Lungs CTAB. Musculoskeletal: No obvious deformity to the left elbow.  No edema or ecchymosis.  Patient has full range of motion to grimace of pain. Neurologic:  Normal speech and language. No gross focal neurologic deficits are appreciated. No gait instability. Skin:  Skin is warm, dry and intact. No rash noted. Psychiatric: Mood and affect are  normal. Speech and behavior are normal.  ____________________________________________   LABS (all labs ordered are listed, but only abnormal results are displayed)  Labs Reviewed - No data to display ____________________________________________  EKG   ____________________________________________  RADIOLOGY  ED MD interpretation: No acute findings x-ray of the left elbow.  Official radiology report(s): Dg Elbow Complete Left  Result Date: 01/03/2018 CLINICAL DATA:  Left arm pain.  Fell 1 day ago. EXAM: LEFT ELBOW - COMPLETE 3+ VIEW COMPARISON:  None. FINDINGS: There is no evidence of fracture, dislocation, or joint effusion. There is no evidence of arthropathy or other focal bone abnormality. Soft tissues are unremarkable. IMPRESSION: Negative. Electronically Signed   By: Markus Daft M.D.   On: 01/03/2018 10:12    ____________________________________________   PROCEDURES  Procedure(s) performed: None  Procedures  Critical Care performed: No  ____________________________________________   INITIAL IMPRESSION / ASSESSMENT AND PLAN / ED COURSE  As part of my medical decision making, I reviewed the following data within the electronic MEDICAL RECORD NUMBER    Left elbow pain secondary to fall.  Discussed negative x-ray findings with patient.  Patient placed in arm sling and given a prescription for naproxen.  Patient advised to follow-up with PCP if condition persists.      ____________________________________________   FINAL CLINICAL IMPRESSION(S) / ED DIAGNOSES  Final diagnoses:  Left arm pain     ED Discharge Orders        Ordered    naproxen (NAPROSYN) 500 MG tablet  2 times daily with meals     01/03/18 1040       Note:  This document was prepared using Dragon voice recognition software and may include unintentional dictation errors.    Sable Feil, PA-C 01/03/18 1044    Lavonia Drafts, MD 01/03/18 1316

## 2018-01-03 NOTE — ED Notes (Signed)
See triage note  States she fell yesterday  Landed on left elbow  No deformity noted  Increased pain with movement

## 2018-01-03 NOTE — Discharge Instructions (Signed)
Arm sling for 2-3 days as needed °

## 2018-01-03 NOTE — ED Triage Notes (Signed)
Left arm pain , fell on it x1 day ago , no sensation loss distal to injury

## 2018-09-15 ENCOUNTER — Emergency Department
Admission: EM | Admit: 2018-09-15 | Discharge: 2018-09-15 | Disposition: A | Discharge status: Home / Self Care | Payer: No Typology Code available for payment source | Attending: Emergency Medicine | Admitting: Emergency Medicine

## 2018-09-15 ENCOUNTER — Emergency Department: Payer: No Typology Code available for payment source

## 2018-09-15 DIAGNOSIS — Z79899 Other long term (current) drug therapy: Secondary | ICD-10-CM | POA: Insufficient documentation

## 2018-09-15 DIAGNOSIS — M546 Pain in thoracic spine: Secondary | ICD-10-CM | POA: Insufficient documentation

## 2018-09-15 DIAGNOSIS — M542 Cervicalgia: Secondary | ICD-10-CM | POA: Insufficient documentation

## 2018-09-15 LAB — POCT PREGNANCY, URINE: PREG TEST UR: NEGATIVE

## 2018-09-15 MED ORDER — CYCLOBENZAPRINE HCL 5 MG PO TABS: 5.0000 mg | ORAL_TABLET | Freq: Three times a day (TID) | ORAL | 0 refills | Status: AC | PRN

## 2018-09-15 MED ORDER — MELOXICAM 15 MG PO TABS: 15.0000 mg | ORAL_TABLET | Freq: Every day | ORAL | 0 refills | Status: AC

## 2018-09-15 NOTE — ED Notes (Signed)
Pt to xray

## 2018-09-15 NOTE — ED Notes (Signed)
PA in with pt 

## 2018-09-15 NOTE — ED Notes (Addendum)
Pt calling out for graham crackers for herself and signifigant other. Pt also asking for tylenol for pain. PA notified.
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
## 2018-11-23 DIAGNOSIS — D352 Benign neoplasm of pituitary gland: Secondary | ICD-10-CM

## 2018-11-23 HISTORY — DX: Benign neoplasm of pituitary gland: D35.2

## 2018-12-06 ENCOUNTER — Emergency Department
Admission: EM | Admit: 2018-12-06 | Discharge: 2018-12-06 | Disposition: A | Discharge status: Home / Self Care | Payer: BC Managed Care – PPO | Attending: Emergency Medicine | Admitting: Emergency Medicine

## 2018-12-06 ENCOUNTER — Other Ambulatory Visit: Payer: Self-pay

## 2018-12-06 ENCOUNTER — Encounter: Payer: Self-pay | Admitting: Emergency Medicine

## 2018-12-06 DIAGNOSIS — Z79899 Other long term (current) drug therapy: Secondary | ICD-10-CM | POA: Insufficient documentation

## 2018-12-06 DIAGNOSIS — K529 Noninfective gastroenteritis and colitis, unspecified: Secondary | ICD-10-CM | POA: Diagnosis not present

## 2018-12-06 DIAGNOSIS — R1110 Vomiting, unspecified: Secondary | ICD-10-CM | POA: Diagnosis present

## 2018-12-06 LAB — URINALYSIS, COMPLETE (UACMP) WITH MICROSCOPIC
BACTERIA UA: NONE SEEN
Bilirubin Urine: NEGATIVE
Glucose, UA: NEGATIVE mg/dL
Hgb urine dipstick: NEGATIVE
Ketones, ur: NEGATIVE mg/dL
Leukocytes, UA: NEGATIVE
Nitrite: NEGATIVE
PH: 6.5 (ref 5.0–8.0)
Protein, ur: NEGATIVE mg/dL
RBC / HPF: NONE SEEN RBC/hpf (ref 0–5)
Specific Gravity, Urine: 1.02 (ref 1.005–1.030)

## 2018-12-06 LAB — COMPREHENSIVE METABOLIC PANEL
ALBUMIN: 4.5 g/dL (ref 3.5–5.0)
ALT: 17 U/L (ref 0–44)
AST: 21 U/L (ref 15–41)
Alkaline Phosphatase: 50 U/L (ref 38–126)
Anion gap: 7 (ref 5–15)
BUN: 8 mg/dL (ref 6–20)
CO2: 24 mmol/L (ref 22–32)
Calcium: 8.9 mg/dL (ref 8.9–10.3)
Chloride: 109 mmol/L (ref 98–111)
Creatinine, Ser: 0.65 mg/dL (ref 0.44–1.00)
GFR calc Af Amer: >60 mL/min (ref >60)
GFR calc non Af Amer: >60 mL/min (ref >60)
GLUCOSE: 73 mg/dL (ref 70–99)
Potassium: 3.5 mmol/L (ref 3.5–5.1)
Sodium: 140 mmol/L (ref 135–145)
Total Bilirubin: 0.7 mg/dL (ref 0.3–1.2)
Total Protein: 8 g/dL (ref 6.5–8.1)

## 2018-12-06 LAB — CBC
HCT: 37.2 % (ref 36.0–46.0)
Hemoglobin: 12.1 g/dL (ref 12.0–15.0)
MCH: 28.5 pg (ref 26.0–34.0)
MCHC: 32.5 g/dL (ref 30.0–36.0)
MCV: 87.5 fL (ref 80.0–100.0)
Platelets: 288 10*3/uL (ref 150–400)
RBC: 4.25 MIL/uL (ref 3.87–5.11)
RDW: 12.8 % (ref 11.5–15.5)
WBC: 5 10*3/uL (ref 4.0–10.5)
nRBC: 0 % (ref 0.0–0.2)

## 2018-12-06 LAB — LIPASE, BLOOD: Lipase: 30 U/L (ref 11–51)

## 2018-12-06 LAB — POCT PREGNANCY, URINE: Preg Test, Ur: NEGATIVE

## 2018-12-06 MED ORDER — ONDANSETRON 4 MG PO TBDP: 4.0000 mg | ORAL_TABLET | Freq: Three times a day (TID) | ORAL | 0 refills | Status: DC | PRN
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
## 2018-12-06 NOTE — ED Notes (Signed)
This RN attempted to draw blood x2 with no success.

## 2018-12-06 NOTE — ED Triage Notes (Signed)
PT c/o n/v/d x2days with abd pain. NAD noted, VSS

## 2018-12-06 NOTE — ED Notes (Signed)
Pt resting, no distress, boyfriend at bedside.

## 2019-05-09 DIAGNOSIS — D352 Benign neoplasm of pituitary gland: Secondary | ICD-10-CM | POA: Insufficient documentation

## 2019-09-02 ENCOUNTER — Emergency Department
Admission: EM | Admit: 2019-09-02 | Discharge: 2019-09-02 | Disposition: A | Discharge status: Home / Self Care | Payer: Medicaid Other | Attending: Emergency Medicine | Admitting: Emergency Medicine

## 2019-09-02 ENCOUNTER — Encounter: Payer: Self-pay | Admitting: Emergency Medicine

## 2019-09-02 ENCOUNTER — Other Ambulatory Visit: Payer: Self-pay

## 2019-09-02 DIAGNOSIS — K2900 Acute gastritis without bleeding: Secondary | ICD-10-CM | POA: Insufficient documentation

## 2019-09-02 DIAGNOSIS — Z79899 Other long term (current) drug therapy: Secondary | ICD-10-CM | POA: Insufficient documentation

## 2019-09-02 HISTORY — DX: Benign neoplasm of pituitary gland: D35.2

## 2019-09-02 LAB — COMPREHENSIVE METABOLIC PANEL
ALT: 20 U/L (ref 0–44)
AST: 20 U/L (ref 15–41)
Albumin: 4 g/dL (ref 3.5–5.0)
Alkaline Phosphatase: 55 U/L (ref 38–126)
Anion gap: 10 (ref 5–15)
BUN: 7 mg/dL (ref 6–20)
CO2: 25 mmol/L (ref 22–32)
Calcium: 8.9 mg/dL (ref 8.9–10.3)
Chloride: 105 mmol/L (ref 98–111)
Creatinine, Ser: 0.75 mg/dL (ref 0.44–1.00)
GFR calc Af Amer: >60 mL/min (ref >60)
GFR calc non Af Amer: >60 mL/min (ref >60)
Glucose, Bld: 90 mg/dL (ref 70–99)
Potassium: 3.5 mmol/L (ref 3.5–5.1)
Sodium: 140 mmol/L (ref 135–145)
Total Bilirubin: 0.5 mg/dL (ref 0.3–1.2)
Total Protein: 7.6 g/dL (ref 6.5–8.1)

## 2019-09-02 LAB — CBC WITH DIFFERENTIAL/PLATELET
Abs Immature Granulocytes: 0.01 10*3/uL (ref 0.00–0.07)
Basophils Absolute: 0 10*3/uL (ref 0.0–0.1)
Basophils Relative: 0 %
Eosinophils Absolute: 0.1 10*3/uL (ref 0.0–0.5)
Eosinophils Relative: 2 %
HCT: 36.5 % (ref 36.0–46.0)
Hemoglobin: 11.7 g/dL — ABNORMAL LOW (ref 12.0–15.0)
Immature Granulocytes: 0 %
Lymphocytes Relative: 43 %
Lymphs Abs: 1.7 10*3/uL (ref 0.7–4.0)
MCH: 27.3 pg (ref 26.0–34.0)
MCHC: 32.1 g/dL (ref 30.0–36.0)
MCV: 85.1 fL (ref 80.0–100.0)
Monocytes Absolute: 0.5 10*3/uL (ref 0.1–1.0)
Monocytes Relative: 12 %
Neutro Abs: 1.7 10*3/uL (ref 1.7–7.7)
Neutrophils Relative %: 43 %
Platelets: 264 10*3/uL (ref 150–400)
RBC: 4.29 MIL/uL (ref 3.87–5.11)
RDW: 13.5 % (ref 11.5–15.5)
WBC: 3.9 10*3/uL — ABNORMAL LOW (ref 4.0–10.5)
nRBC: 0 % (ref 0.0–0.2)

## 2019-09-02 LAB — POCT PREGNANCY, URINE: Preg Test, Ur: NEGATIVE

## 2019-09-02 LAB — URINALYSIS, COMPLETE (UACMP) WITH MICROSCOPIC
Bacteria, UA: NONE SEEN
Bilirubin Urine: NEGATIVE
Glucose, UA: NEGATIVE mg/dL
Ketones, ur: NEGATIVE mg/dL
Leukocytes,Ua: NEGATIVE
Nitrite: NEGATIVE
Protein, ur: NEGATIVE mg/dL
Specific Gravity, Urine: 1.018 (ref 1.005–1.030)
pH: 6 (ref 5.0–8.0)

## 2019-09-02 LAB — LIPASE, BLOOD: Lipase: 30 U/L (ref 11–51)

## 2019-09-02 MED ORDER — LIDOCAINE VISCOUS HCL 2 % MT SOLN
15.0000 mL | Freq: Once | OROMUCOSAL | Status: AC
  Administered 2019-09-02: 15 mL via ORAL
  Filled 2019-09-02: qty 15

## 2019-09-02 MED ORDER — ALUM & MAG HYDROXIDE-SIMETH 200-200-20 MG/5ML PO SUSP
30.0000 mL | Freq: Once | ORAL | Status: AC
  Administered 2019-09-02: 30 mL via ORAL
  Filled 2019-09-02: qty 30

## 2019-09-02 MED ORDER — PANTOPRAZOLE SODIUM 20 MG PO TBEC: 20.0000 mg | DELAYED_RELEASE_TABLET | Freq: Every day | ORAL | 1 refills | Status: DC
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
## 2019-09-02 NOTE — ED Triage Notes (Signed)
Pt c/o abdominal pain X 1 week with decreased appetite.  Has had diarrhea. When asked where hurting pt states "my whole abdominal area" pointing to entire abdomen.  No vomiting or fevers.

## 2019-09-04 ENCOUNTER — Telehealth: Payer: Self-pay | Admitting: Gastroenterology

## 2019-09-04 NOTE — Telephone Encounter (Signed)
Left vm to offer Ed f/u apt with Dr. Vanga °

## 2019-09-15 ENCOUNTER — Encounter: Payer: Self-pay | Admitting: Gastroenterology

## 2019-12-28 ENCOUNTER — Other Ambulatory Visit: Payer: Self-pay | Admitting: Physician Assistant

## 2019-12-28 DIAGNOSIS — R102 Pelvic and perineal pain: Secondary | ICD-10-CM

## 2020-01-03 ENCOUNTER — Other Ambulatory Visit: Payer: Self-pay

## 2020-01-03 ENCOUNTER — Ambulatory Visit
Admission: RE | Admit: 2020-01-03 | Discharge: 2020-01-03 | Disposition: A | Discharge status: Home / Self Care | Payer: Medicaid Other | Source: Ambulatory Visit | Attending: Physician Assistant | Admitting: Physician Assistant

## 2020-01-03 DIAGNOSIS — R102 Pelvic and perineal pain: Secondary | ICD-10-CM | POA: Insufficient documentation

## 2020-02-14 ENCOUNTER — Encounter: Payer: Self-pay | Admitting: Emergency Medicine

## 2020-02-14 ENCOUNTER — Emergency Department
Admission: EM | Admit: 2020-02-14 | Discharge: 2020-02-14 | Disposition: A | Discharge status: Home / Self Care | Payer: Medicaid Other | Attending: Emergency Medicine | Admitting: Emergency Medicine

## 2020-02-14 ENCOUNTER — Other Ambulatory Visit: Payer: Self-pay

## 2020-02-14 DIAGNOSIS — Z79899 Other long term (current) drug therapy: Secondary | ICD-10-CM | POA: Insufficient documentation

## 2020-02-14 DIAGNOSIS — K625 Hemorrhage of anus and rectum: Secondary | ICD-10-CM

## 2020-02-14 DIAGNOSIS — K5900 Constipation, unspecified: Secondary | ICD-10-CM

## 2020-02-14 NOTE — Discharge Instructions (Addendum)
Follow-up with your primary care provider if any continued problems or concerns.  Avoid constipation by taking stool softener such as Colace once a day or MiraLAX 1/2-1 capful daily mixed with any beverage and followed by lots of water.  That increasing the fruit and vegetables in your diet will decrease your chances of constipation.  You may also use glycerin suppositories which are over-the-counter if you become constipated and are having a difficult time having a bowel movement.

## 2020-02-14 NOTE — ED Triage Notes (Addendum)
Patient reports noting small amount of bright red blood after bowel movements.  Patient is in no obvious distress at this time.  Denies bloody stools.  Patient reports rectal itching as well.

## 2020-02-14 NOTE — ED Provider Notes (Signed)
[facility] Emergency Department Provider Note   ____________________________________________   First MD Initiated Contact with Patient 02/14/20 1150     (approximate)  I have reviewed the triage vital signs and the nursing notes.   HISTORY  Chief Complaint Rectal Bleeding  HPI Ana Moran is a 24 y.o. female presents to the ED with complaint of small amount of bright red blood after a bowel movement today.  Patient noticed bright red blood on the toilet paper and also in the toilet bowl.  She states she has had some rectal itching as well.  She denies any known hemorrhoids.  Patient does not own any blood thinners or aspirin routinely.  She does admit to constipation for several days and this also is an ongoing issue.  Patient also has a picture that she took that was noted to have bright red blood with stool present.  Currently patient rates her pain as a 0/10.      Past Medical History:  Diagnosis Date  . Inverted nipple   . Motion sickness    cars  . Prolactinoma ([place])     There are no problems to display for this patient.   Past Surgical History:  Procedure Laterality Date  . ANKLE SURGERY    . GANGLION CYST EXCISION Left 11/24/2017   Procedure: REMOVAL GANGLION CYST ANKLE;  Surgeon: Corky Mull, MD;  Location: [place];  Service: Orthopedics;  Laterality: Left;  . KNEE ARTHROSCOPY WITH MENISCAL REPAIR Left 11/14/2015   Procedure: KNEE ARTHROSCOPY WITH partial synovectomy;  Surgeon: Corky Mull, MD;  Location: [facility];  Service: Orthopedics;  Laterality: Left;    Prior to Admission medications   Medication Sig Start Date End Date Taking? Authorizing Provider  albuterol (PROVENTIL HFA;VENTOLIN HFA) 108 (90 Base) MCG/ACT inhaler Inhale 2 puffs into the lungs every 6 (six) hours as needed for wheezing or shortness of breath. 10/23/17   Arta Silence, MD  PARoxetine (PAXIL) 10 MG tablet Take 10 mg by mouth daily.     [provider]  pantoprazole (PROTONIX) 20 MG tablet Take 1 tablet (20 mg total) by mouth daily. 09/02/19 02/14/20  Lavonia Drafts, MD    Allergies Strawberry (diagnostic)  Family History  Problem Relation Age of Onset  . Breast cancer Maternal Aunt     Social History Social History   Tobacco Use  . Smoking status: Never Smoker  . Smokeless tobacco: Never Used  Substance Use Topics  . Alcohol use: No  . Drug use: No    Review of Systems Constitutional: No fever/chills Cardiovascular: Denies chest pain. Respiratory: Denies shortness of breath. Gastrointestinal: No abdominal pain.  No nausea, no vomiting.  No diarrhea.  Positive constipation. Genitourinary: Negative for dysuria. Musculoskeletal: Negative for back pain. Skin: Negative for rash. Neurological: Negative for headaches, focal weakness or numbness. ____________________________________________   PHYSICAL EXAM:  VITAL SIGNS: ED Triage Vitals  Enc Vitals Group     BP 02/14/20 1047 123/71     Pulse Rate 02/14/20 1047 85     Resp 02/14/20 1047 14     Temp 02/14/20 1047 98.2 F (36.8 C)     Temp Source 02/14/20 1047 Oral     SpO2 02/14/20 1047 99 %     Weight 02/14/20 1048 170 lb (77.1 kg)     Height 02/14/20 1048 5\' 4"  (1.626 m)     Head Circumference --      Peak Flow --  Pain Score 02/14/20 1043 0     Pain Loc --      Pain Edu? --      Excl. in [place]? --    Constitutional: Alert and oriented. Well appearing and in no acute distress. Eyes: Conjunctivae are normal.  Head: Atraumatic. Neck: No stridor.   Cardiovascular: Normal rate, regular rhythm. Grossly normal heart sounds.  Good peripheral circulation. Respiratory: Normal respiratory effort.  No retractions. Lungs CTAB. Gastrointestinal: Soft and nontender. No distention.  On rectal exam there is no external hemorrhoids noted or active bleeding.  No fissures present.  No lesions or skin discoloration. Musculoskeletal: Moves upper and  lower extremities without any difficulty.  Normal gait was noted. Neurologic:  Normal speech and language. No gross focal neurologic deficits are appreciated. No gait instability. Skin:  Skin is warm, dry and intact. No rash noted. Psychiatric: Mood and affect are normal. Speech and behavior are normal.  ____________________________________________   LABS (all labs ordered are listed, but only abnormal results are displayed)  Labs Reviewed - No data to display ____________________________________________  PROCEDURES  Procedure(s) performed (including Critical Care):  Procedures   ____________________________________________   INITIAL IMPRESSION / ASSESSMENT AND PLAN / ED COURSE  As part of my medical decision making, I reviewed the following data within the electronic MEDICAL RECORD NUMBER Notes from prior ED visits and [place] Controlled Substance Database  24 year old female presents to the ED after seeing some bright red blood after a bowel movement.  Patient states she has a history of constipation and this was seen after having a difficult BM.  Patient denies any tarry stools or dark blood noted.  Physical exam is reassuring to the patient that she does not have hemorrhoids.  No active blood was seen.  Patient was given a list of medications including Colace and MiraLAX that she can begin using decrease the amount of constipation along with a list of increasing fruits and vegetables to help with her constipation.  She is to follow-up with her PCP if any continued problems. ____________________________________________   FINAL CLINICAL IMPRESSION(S) / ED DIAGNOSES  Final diagnoses:  Constipation, unspecified constipation type  Rectal bleeding     ED Discharge Orders    None       Note:  This document was prepared using Dragon voice recognition software and may include unintentional dictation errors.    Johnn Hai, PA-C 02/14/20 1252    Earleen Newport,  MD 02/14/20 1323

## 2020-12-03 ENCOUNTER — Other Ambulatory Visit: Payer: Medicaid Other

## 2020-12-03 DIAGNOSIS — Z20822 Contact with and (suspected) exposure to covid-19: Secondary | ICD-10-CM

## 2020-12-05 LAB — NOVEL CORONAVIRUS, NAA: SARS-CoV-2, NAA: NOT DETECTED

## 2020-12-05 LAB — SARS-COV-2, NAA 2 DAY TAT

## 2021-06-27 ENCOUNTER — Ambulatory Visit
Admission: RE | Admit: 2021-06-27 | Discharge: 2021-06-27 | Disposition: A | Discharge status: Home / Self Care | Payer: Medicaid Other | Source: Ambulatory Visit | Attending: Family | Admitting: Family

## 2021-06-27 ENCOUNTER — Other Ambulatory Visit: Payer: Self-pay | Admitting: Family

## 2021-06-27 ENCOUNTER — Ambulatory Visit
Admission: RE | Admit: 2021-06-27 | Discharge: 2021-06-27 | Disposition: A | Discharge status: Home / Self Care | Payer: Medicaid Other | Attending: Family | Admitting: Family

## 2021-06-27 ENCOUNTER — Other Ambulatory Visit: Payer: Self-pay

## 2021-06-27 DIAGNOSIS — M25572 Pain in left ankle and joints of left foot: Secondary | ICD-10-CM

## 2021-11-22 ENCOUNTER — Other Ambulatory Visit: Payer: Self-pay

## 2021-11-22 ENCOUNTER — Emergency Department
Admission: EM | Admit: 2021-11-22 | Discharge: 2021-11-22 | Disposition: A | Discharge status: Home / Self Care | Payer: Medicaid Other | Attending: Emergency Medicine | Admitting: Emergency Medicine

## 2021-11-22 DIAGNOSIS — R110 Nausea: Secondary | ICD-10-CM | POA: Insufficient documentation

## 2021-11-22 DIAGNOSIS — E860 Dehydration: Secondary | ICD-10-CM | POA: Insufficient documentation

## 2021-11-22 LAB — CBC WITH DIFFERENTIAL/PLATELET
Abs Immature Granulocytes: 0.02 10*3/uL (ref 0.00–0.07)
Basophils Absolute: 0 10*3/uL (ref 0.0–0.1)
Basophils Relative: 1 %
Eosinophils Absolute: 0 10*3/uL (ref 0.0–0.5)
Eosinophils Relative: 0 %
HCT: 40.2 % (ref 36.0–46.0)
Hemoglobin: 13.5 g/dL (ref 12.0–15.0)
Immature Granulocytes: 0 %
Lymphocytes Relative: 36 %
Lymphs Abs: 2.4 10*3/uL (ref 0.7–4.0)
MCH: 29.2 pg (ref 26.0–34.0)
MCHC: 33.6 g/dL (ref 30.0–36.0)
MCV: 86.8 fL (ref 80.0–100.0)
Monocytes Absolute: 0.6 10*3/uL (ref 0.1–1.0)
Monocytes Relative: 9 %
Neutro Abs: 3.6 10*3/uL (ref 1.7–7.7)
Neutrophils Relative %: 54 %
Platelets: 273 10*3/uL (ref 150–400)
RBC: 4.63 MIL/uL (ref 3.87–5.11)
RDW: 12.8 % (ref 11.5–15.5)
WBC: 6.6 10*3/uL (ref 4.0–10.5)
nRBC: 0 % (ref 0.0–0.2)

## 2021-11-22 LAB — URINALYSIS, ROUTINE W REFLEX MICROSCOPIC
Bacteria, UA: NONE SEEN
Bilirubin Urine: NEGATIVE
Glucose, UA: NEGATIVE mg/dL
Ketones, ur: 20 mg/dL — AB
Leukocytes,Ua: NEGATIVE
Nitrite: NEGATIVE
Protein, ur: NEGATIVE mg/dL
Specific Gravity, Urine: 1.015 (ref 1.005–1.030)
pH: 5 (ref 5.0–8.0)

## 2021-11-22 LAB — POC URINE PREG, ED: Preg Test, Ur: NEGATIVE

## 2021-11-22 LAB — BASIC METABOLIC PANEL
Anion gap: 14 (ref 5–15)
BUN: 8 mg/dL (ref 6–20)
CO2: 18 mmol/L — ABNORMAL LOW (ref 22–32)
Calcium: 9.7 mg/dL (ref 8.9–10.3)
Chloride: 107 mmol/L (ref 98–111)
Creatinine, Ser: 0.77 mg/dL (ref 0.44–1.00)
GFR, Estimated: >60 mL/min (ref >60)
Glucose, Bld: 86 mg/dL (ref 70–99)
Potassium: 3 mmol/L — ABNORMAL LOW (ref 3.5–5.1)
Sodium: 139 mmol/L (ref 135–145)

## 2021-11-22 LAB — TROPONIN I (HIGH SENSITIVITY): Troponin I (High Sensitivity): 3 ng/L (ref <18)

## 2021-11-22 MED ORDER — ONDANSETRON HCL 4 MG/2ML IJ SOLN
4.0000 mg | Freq: Once | INTRAMUSCULAR | Status: AC
  Administered 2021-11-22: 4 mg via INTRAVENOUS
  Filled 2021-11-22: qty 2

## 2021-11-22 NOTE — ED Provider Notes (Signed)
[facility] Emergency Department Provider Note   ____________________________________________    I have reviewed the triage vital signs and the nursing notes.   HISTORY  Chief Complaint Dizziness and Nausea     HPI Ana Moran is a 25 y.o. female who presents with complaints of dizziness and nausea over the last several days.  Patient reports this is likely due to her anxiety.  She states that she has significant anxiety for which she takes hydroxyzine and reports that the last 4 days have been "hard ".  No reports of SI or HI.  She states that sometimes when she gets very anxious she gets nauseated and reports decreased p.o. intake and is concerned that she may be dehydrated.  No abdominal pain no diarrhea.  No fevers chills.  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   Past Surgical History:  Procedure Laterality Date   ANKLE SURGERY     GANGLION CYST EXCISION Left 11/24/2017   Procedure: REMOVAL GANGLION CYST ANKLE;  Surgeon: Corky Mull, MD;  Location: [place];  Service: Orthopedics;  Laterality: Left;   KNEE ARTHROSCOPY WITH MENISCAL REPAIR Left 11/14/2015   Procedure: KNEE ARTHROSCOPY WITH partial synovectomy;  Surgeon: Corky Mull, MD;  Location: [facility];  Service: Orthopedics;  Laterality: Left;    Prior to Admission medications   Medication Sig Start Date End Date Taking? Authorizing Provider  albuterol (PROVENTIL HFA;VENTOLIN HFA) 108 (90 Base) MCG/ACT inhaler Inhale 2 puffs into the lungs every 6 (six) hours as needed for wheezing or shortness of breath. 10/23/17   Arta Silence, MD  PARoxetine (PAXIL) 10 MG tablet Take 10 mg by mouth  daily.    [provider]  pantoprazole (PROTONIX) 20 MG tablet Take 1 tablet (20 mg total) by mouth daily. 09/02/19 02/14/20  Lavonia Drafts, MD     Allergies Strawberry (diagnostic)  Family History  Problem Relation Age of Onset   Breast cancer Maternal Aunt     Social History Social History   Tobacco Use   Smoking status: Never   Smokeless tobacco: Never  Vaping Use   Vaping Use: Never used  Substance Use Topics   Alcohol use: No   Drug use: No    Review of Systems  Constitutional: No fever/chills Eyes: No visual changes.  ENT: No sore throat. Cardiovascular: Denies chest pain. Respiratory: Denies shortness of breath. Gastrointestinal: No abdominal pain.  Genitourinary: Negative for dysuria. Musculoskeletal: Negative for back pain. Skin: Negative for rash. Neurological: Negative for headaches or weakness   ____________________________________________   PHYSICAL EXAM:  VITAL SIGNS: ED Triage Vitals  Enc Vitals Group     BP 11/22/21 1724 (!) 152/92     Pulse Rate 11/22/21 1724 (!) 160     Resp 11/22/21 1724 20     Temp 11/22/21 1724 98.9 F (37.2 C)     Temp src --      SpO2 11/22/21 1724 98 %     Weight 11/22/21 1724 59 kg (130 lb)     Height 11/22/21 1724 1.626 m (5\' 4" )     Head Circumference --  Peak Flow --      Pain Score 11/22/21 1734 0     Pain Loc --      Pain Edu? --      Excl. in [place]? --     Constitutional: Alert and oriented. No acute distress. Pleasant and interactive  Nose: No congestion/rhinnorhea. Mouth/Throat: Mucous membranes are moist.   Neck:  Painless ROM Cardiovascular: Normal rate, regular rhythm. Grossly normal heart sounds.  Good peripheral circulation. Respiratory: Normal respiratory effort.  No retractions. Gastrointestinal: Soft and nontender. No distention.    Musculoskeletal: No lower extremity tenderness nor edema.  Warm and well perfused Neurologic:  Normal speech and language. No gross focal  neurologic deficits are appreciated.  Skin:  Skin is warm, dry and intact. No rash noted. Psychiatric: Mood and affect are normal. Speech and behavior are normal.  ____________________________________________   LABS (all labs ordered are listed, but only abnormal results are displayed)  Labs Reviewed  BASIC METABOLIC PANEL - Abnormal; Notable for the following components:      Result Value   Potassium 3.0 (*)    CO2 18 (*)    All other components within normal limits  URINALYSIS, ROUTINE W REFLEX MICROSCOPIC - Abnormal; Notable for the following components:   Color, Urine YELLOW (*)    APPearance CLEAR (*)    Hgb urine dipstick SMALL (*)    Ketones, ur 20 (*)    All other components within normal limits  POC URINE PREG, ED - Normal  CBC WITH DIFFERENTIAL/PLATELET  TROPONIN I (HIGH SENSITIVITY)   ____________________________________________  EKG  ED ECG REPORT I, Lavonia Drafts, the attending physician, personally viewed and interpreted this ECG.  Date: 11/22/2021  Rhythm: normal sinus rhythm QRS Axis: normal Intervals: normal ST/T Wave abnormalities: Nonspecific ST changes Narrative Interpretation: no evidence of acute ischemia  ____________________________________________  RADIOLOGY  None ____________________________________________   PROCEDURES  Procedure(s) performed: No  Procedures   Critical Care performed: No ____________________________________________   INITIAL IMPRESSION / ASSESSMENT AND PLAN / ED COURSE  Pertinent labs & imaging results that were available during my care of the patient were reviewed by me and considered in my medical decision making (see chart for details).   Patient well-appearing and in no acute distress, she does have significant tachycardia initially however after a brief period this dropped into the 1 teens.  I did review of past visits with her PCP and she only seems to have significant sinus tachycardia in the office that  she has noted.  No fevers or chills.  Lab work today is overall quite reassuring, she has a very mild hypokalemia.  Normal CBC, normal troponin.  She suspects her symptoms are related to anxiety and I feel this is likely the case.  We are treating with IV fluids we will give IV Zofran as well  Patient is feeling improved, appropriate for discharge at this time    ____________________________________________   FINAL CLINICAL IMPRESSION(S) / ED DIAGNOSES  Final diagnoses:  Dehydration        Note:  This document was prepared using Dragon voice recognition software and may include unintentional dictation errors.    Lavonia Drafts, MD 11/22/21 [phone]

## 2021-11-22 NOTE — ED Triage Notes (Signed)
Pt states she has had nausea and dizziness x several days. Pt states she does not have any consistent precipitating factors. Pt states she has had decreased appetite.

## 2021-11-22 NOTE — ED Provider Notes (Signed)
Emergency Medicine Provider Triage Evaluation Note  Ana Moran , a 25 y.o. female  was evaluated in triage.  Pt complains of not feeling well for over a month.  Has seen her PCP and had labs which were normal.  States her vitamin D was little low.  Patient states her anxiety gets bad and she takes hydroxyzine for her anxiety.  Now her heart rate is up.  She is unsure if it is because she is scared from being here in the ED or if her heart rates been up for a while.  States she has been eating and drinking well.  Question if she is dehydrated.  Has had unprotected sex also with concerns of pregnancy.  Review of Systems  Positive: Heart racing, anxiety Negative: Fever, chills, chest pain, shortness of breath  Physical Exam  BP (!) 152/92 (BP Location: Left Arm)    Pulse (!) 160 Comment: pt stated, "feeling anxious." Manuela Schwartz PA aware.   Temp 98.9 F (37.2 C)    Resp 20    Ht 5\' 4"  (1.626 m)    Wt 59 kg    SpO2 98%    BMI 22.31 kg/m  Gen:   Awake, no distress   Resp:  Normal effort  MSK:   Moves extremities without difficulty  Other:    Medical Decision Making  Medically screening exam initiated at 5:28 PM.  Appropriate orders placed.  EZMA REHM was informed that the remainder of the evaluation will be completed by another provider, this initial triage assessment does not replace that evaluation, and the importance of remaining in the ED until their evaluation is complete.     Versie Starks, PA-C 11/22/21 1729    Naaman Plummer, MD 11/28/21 [phone]

## 2022-03-07 NOTE — Progress Notes (Deleted)
Cardiology Office Note ? ?Date:  03/07/2022  ? ?ID:  Ana Moran, DOB 1996/04/01, MRN [id number] ? ?PCP:  [facility] [place]  ? ?No chief complaint on file. ? ? ?HPI:  ?[name] is a 26 year old woman with past medical history of ?Anxiety ?Who presents by referral from Ana Moran for consultation of her tachycardia ? ?Seen in the emergency room November 22, 2021 for anxiety, dizzy, nausea, noted to be tachycardic ?EKG with sinus tachycardia rate 140 bpm ?Reported having tachycardia when seen in the doctor's offices ? ? ?PMH:   has a past medical history of Inverted nipple, Motion sickness, and Prolactinoma ([place]). ? ?PSH:    ?Past Surgical History:  ?Procedure Laterality Date  ? ANKLE SURGERY    ? GANGLION CYST EXCISION Left 11/24/2017  ? Procedure: REMOVAL GANGLION CYST ANKLE;  Surgeon: Ana Mull, MD;  Location: [place];  Service: Orthopedics;  Laterality: Left;  ? KNEE ARTHROSCOPY WITH MENISCAL REPAIR Left 11/14/2015  ? Procedure: KNEE ARTHROSCOPY WITH partial synovectomy;  Surgeon: Ana Mull, MD;  Location: [facility];  Service: Orthopedics;  Laterality: Left;  ? ? ?Current Outpatient Medications  ?Medication Sig Dispense Refill  ? albuterol (PROVENTIL HFA;VENTOLIN HFA) 108 (90 Base) MCG/ACT inhaler Inhale 2 puffs into the lungs every 6 (six) hours as needed for wheezing or shortness of breath. 1 Inhaler 0  ? PARoxetine (PAXIL) 10 MG tablet Take 10 mg by mouth daily.    ? ?No current facility-administered medications for this visit.  ? ? ? ?Allergies:   Strawberry (diagnostic)  ? ?Social History:  The patient  reports that she has never smoked. She has never used smokeless tobacco. She reports that she does not drink alcohol and does not use drugs.  ? ?Family History:   family history includes Breast cancer in her maternal aunt.  ? ? ?Review of Systems: ?ROS ? ? ?PHYSICAL EXAM: ?VS:  There were no vitals taken for this visit. , BMI There is no height or  weight on file to calculate BMI. ?GEN: Well nourished, well developed, in no acute distress ?HEENT: normal ?Neck: no JVD, carotid bruits, or masses ?Cardiac: RRR; no murmurs, rubs, or gallops,no edema  ?Respiratory:  clear to auscultation bilaterally, normal work of breathing ?GI: soft, nontender, nondistended, + BS ?MS: no deformity or atrophy ?Skin: warm and dry, no rash ?Neuro:  Strength and sensation are intact ?Psych: euthymic mood, full affect ? ? ? ?Recent Labs: ?11/22/2021: BUN 8; Creatinine, Ser 0.77; Hemoglobin 13.5; Platelets 273; Potassium 3.0; Sodium 139  ? ? ?Lipid Panel ?No results found for: CHOL, HDL, LDLCALC, TRIG ?  ? ?Wt Readings from Last 3 Encounters:  ?11/22/21 59 kg  ?02/14/20 77.1 kg  ?09/02/19 76.7 kg  ?  ? ? ? ?ASSESSMENT AND PLAN: ? ?Problem List Items Addressed This Visit   ?None ? ? ? ?Disposition:   F/U  12 months ? ? Total encounter time more than 30 minutes ? Greater than 50% was spent in counseling and coordination of care with the patient ? ? ? ?Signed, ?Esmond Plants, M.D., Ph.D. ?[facility] [place], [place] ?[phone] ?

## 2022-03-09 ENCOUNTER — Ambulatory Visit: Payer: Self-pay | Admitting: Cardiovascular Disease

## 2022-03-09 DIAGNOSIS — R000 Tachycardia, unspecified: Secondary | ICD-10-CM

## 2022-03-09 DIAGNOSIS — F419 Anxiety disorder, unspecified: Secondary | ICD-10-CM

## 2022-04-06 DIAGNOSIS — I479 Paroxysmal tachycardia, unspecified: Secondary | ICD-10-CM | POA: Insufficient documentation

## 2022-04-06 NOTE — Progress Notes (Deleted)
Cardiology Office Note  Date:  04/06/2022   ID:  [name], Bound 04/15/1996, MRN [id number]  PCP:  [facility] [place]   No chief complaint on file.   HPI:  [name] is a 26 year old woman with past medical history of Anxiety Who presents by referral from Kerri Perches for consultation of her tachycardia  Seen in the emergency room November 22, 2021 for anxiety, dizzy, nausea, noted to be tachycardic EKG with sinus tachycardia rate 140 bpm Reported having tachycardia when seen in the doctor's offices   PMH:   has a past medical history of Inverted nipple, Motion sickness, and Prolactinoma ([place]).  PSH:    Past Surgical History:  Procedure Laterality Date   ANKLE SURGERY     GANGLION CYST EXCISION Left 11/24/2017   Procedure: REMOVAL GANGLION CYST ANKLE;  Surgeon: Corky Mull, MD;  Location: [place];  Service: Orthopedics;  Laterality: Left;   KNEE ARTHROSCOPY WITH MENISCAL REPAIR Left 11/14/2015   Procedure: KNEE ARTHROSCOPY WITH partial synovectomy;  Surgeon: Corky Mull, MD;  Location: [facility];  Service: Orthopedics;  Laterality: Left;    Current Outpatient Medications  Medication Sig Dispense Refill   albuterol (PROVENTIL HFA;VENTOLIN HFA) 108 (90 Base) MCG/ACT inhaler Inhale 2 puffs into the lungs every 6 (six) hours as needed for wheezing or shortness of breath. 1 Inhaler 0   PARoxetine (PAXIL) 10 MG tablet Take 10 mg by mouth daily.     No current facility-administered medications for this visit.     Allergies:   Strawberry (diagnostic)   Social History:  The patient  reports that she has never smoked. She has never used smokeless tobacco. She reports that she does not drink alcohol and does not use drugs.   Family History:   family history includes Breast cancer in her maternal aunt.    Review of Systems: ROS   PHYSICAL EXAM: VS:  There were no vitals taken for this visit. , BMI There is no height or  weight on file to calculate BMI. GEN: Well nourished, well developed, in no acute distress HEENT: normal Neck: no JVD, carotid bruits, or masses Cardiac: RRR; no murmurs, rubs, or gallops,no edema  Respiratory:  clear to auscultation bilaterally, normal work of breathing GI: soft, nontender, nondistended, + BS MS: no deformity or atrophy Skin: warm and dry, no rash Neuro:  Strength and sensation are intact Psych: euthymic mood, full affect    Recent Labs: 11/22/2021: BUN 8; Creatinine, Ser 0.77; Hemoglobin 13.5; Platelets 273; Potassium 3.0; Sodium 139    Lipid Panel No results found for: CHOL, HDL, LDLCALC, TRIG    Wt Readings from Last 3 Encounters:  11/22/21 130 lb (59 kg)  02/14/20 170 lb (77.1 kg)  09/02/19 169 lb (76.7 kg)       ASSESSMENT AND PLAN:  Problem List Items Addressed This Visit   None    Disposition:   F/U  12 months   Total encounter time more than 30 minutes  Greater than 50% was spent in counseling and coordination of care with the patient    Signed, Esmond Plants, M.D., Ph.D. [place], [place]

## 2022-04-07 ENCOUNTER — Ambulatory Visit: Payer: Self-pay | Admitting: Cardiovascular Disease

## 2022-04-07 DIAGNOSIS — I479 Paroxysmal tachycardia, unspecified: Secondary | ICD-10-CM

## 2022-04-08 ENCOUNTER — Encounter: Payer: Self-pay | Admitting: Cardiovascular Disease

## 2022-04-22 ENCOUNTER — Encounter: Payer: Self-pay | Admitting: *Deleted

## 2022-04-23 ENCOUNTER — Ambulatory Visit: Payer: Self-pay | Admitting: Cardiology

## 2022-04-23 ENCOUNTER — Telehealth: Payer: Self-pay | Admitting: Cardiology

## 2022-04-23 NOTE — Telephone Encounter (Signed)
Spoke w/ pt.  She reports that her car is overheating and she cannot get here for her appt @ 4:00.  She would like to know if her appt can be virtual.   Advised her that I spoke w/ Dr. Ellyn Hack and he would prefer an in person visit since she is new to the office. Pt previously had 2 appts w/ Dr. Rockey Situ, but she cancelled one and No Showed for another.  She states that our office is difficult to get ahold of, so she was unable to call to cancel. She will cancel today's ov and call back once her car is running.

## 2022-04-23 NOTE — Telephone Encounter (Signed)
Pt calling in regards to scheduled appt for today at 4:00 pm with Dr. Ellyn Hack. Pt needs a nurse to give her a callback as soon as possible. Please advise

## 2022-04-23 NOTE — Telephone Encounter (Signed)
Attempted to contact pt.  Call went straight to vm.   Left message asking her to call back.

## 2022-06-11 ENCOUNTER — Ambulatory Visit: Payer: Self-pay | Admitting: Physician Assistant

## 2022-10-13 ENCOUNTER — Inpatient Hospital Stay (HOSPITAL_COMMUNITY): Payer: Medicaid Other

## 2022-10-13 ENCOUNTER — Encounter (HOSPITAL_COMMUNITY): Payer: Self-pay | Admitting: Obstetrics and Gynecology

## 2022-10-13 ENCOUNTER — Inpatient Hospital Stay (HOSPITAL_COMMUNITY)
Admission: AD | Admit: 2022-10-13 | Discharge: 2022-10-13 | Disposition: A | Discharge status: Home / Self Care | Payer: Medicaid Other | Attending: Obstetrics and Gynecology | Admitting: Obstetrics and Gynecology

## 2022-10-13 ENCOUNTER — Ambulatory Visit: Payer: Medicaid Other

## 2022-10-13 DIAGNOSIS — O26891 Other specified pregnancy related conditions, first trimester: Secondary | ICD-10-CM | POA: Insufficient documentation

## 2022-10-13 DIAGNOSIS — O98811 Other maternal infectious and parasitic diseases complicating pregnancy, first trimester: Secondary | ICD-10-CM | POA: Insufficient documentation

## 2022-10-13 DIAGNOSIS — Z3A01 Less than 8 weeks gestation of pregnancy: Secondary | ICD-10-CM | POA: Diagnosis not present

## 2022-10-13 DIAGNOSIS — R1030 Lower abdominal pain, unspecified: Secondary | ICD-10-CM | POA: Diagnosis not present

## 2022-10-13 DIAGNOSIS — B379 Candidiasis, unspecified: Secondary | ICD-10-CM | POA: Insufficient documentation

## 2022-10-13 DIAGNOSIS — O09291 Supervision of pregnancy with other poor reproductive or obstetric history, first trimester: Secondary | ICD-10-CM | POA: Insufficient documentation

## 2022-10-13 DIAGNOSIS — R109 Unspecified abdominal pain: Secondary | ICD-10-CM

## 2022-10-13 LAB — URINALYSIS, ROUTINE W REFLEX MICROSCOPIC
Bilirubin Urine: NEGATIVE
Glucose, UA: NEGATIVE mg/dL
Hgb urine dipstick: NEGATIVE
Ketones, ur: 80 mg/dL — AB
Leukocytes,Ua: NEGATIVE
Nitrite: NEGATIVE
Protein, ur: NEGATIVE mg/dL
Specific Gravity, Urine: 1.028 (ref 1.005–1.030)
pH: 5 (ref 5.0–8.0)

## 2022-10-13 LAB — CBC
HCT: 36 % (ref 36.0–46.0)
Hemoglobin: 12.6 g/dL (ref 12.0–15.0)
MCH: 30 pg (ref 26.0–34.0)
MCHC: 35 g/dL (ref 30.0–36.0)
MCV: 85.7 fL (ref 80.0–100.0)
Platelets: 292 10*3/uL (ref 150–400)
RBC: 4.2 MIL/uL (ref 3.87–5.11)
RDW: 12.1 % (ref 11.5–15.5)
WBC: 7.7 10*3/uL (ref 4.0–10.5)
nRBC: 0 % (ref 0.0–0.2)

## 2022-10-13 LAB — WET PREP, GENITAL
Clue Cells Wet Prep HPF POC: NONE SEEN
Sperm: NONE SEEN
Trich, Wet Prep: NONE SEEN
WBC, Wet Prep HPF POC: <10 (ref <10)

## 2022-10-13 LAB — HCG, QUANTITATIVE, PREGNANCY: hCG, Beta Chain, Quant, S: 28777 m[IU]/mL — ABNORMAL HIGH (ref <5)

## 2022-10-13 LAB — HIV ANTIBODY (ROUTINE TESTING W REFLEX): HIV Screen 4th Generation wRfx: NONREACTIVE

## 2022-10-13 LAB — ABO/RH: ABO/RH(D): O POS

## 2022-10-13 LAB — POCT PREGNANCY, URINE: Preg Test, Ur: POSITIVE — AB

## 2022-10-13 MED ORDER — TERCONAZOLE 0.4 % VA CREA: 1.0000 | TOPICAL_CREAM | Freq: Every day | VAGINAL | 0 refills | Status: DC

## 2022-10-13 NOTE — Discharge Instructions (Addendum)
Prenatal Care Providers           [facility]  [address] [phone]  [facility]   [address]  [phone]  [facility] @ [facility]       [address] [phone]            [facility] @ [place]     [address] [phone]          [facility] @ [place]   [address] [phone]  [facility]  [address] [phone]     [facility]  [address]   [phone]     [facility]  Phone: [phone]  [facility]  Phone: [phone]  [facility] Phone: [phone]  [facility] Phone: [phone]  [facility] Phone: [phone]  [facility] Phone: [phone]  [facility]  Phone: [phone] Safe Medications in Pregnancy   Acne: Benzoyl Peroxide Salicylic Acid  Backache/Headache: Tylenol: 2 regular strength every 4 hours OR              2 Extra strength every 6 hours  Colds/Coughs/Allergies: Benadryl (alcohol free) 25 mg every 6 hours as needed Breath right strips Claritin Cepacol throat lozenges Chloraseptic throat spray Cold-Eeze- up to three times per day Cough drops, alcohol free Flonase (by prescription only) Guaifenesin Mucinex Robitussin DM (plain only, alcohol free) Saline nasal spray/drops Sudafed (pseudoephedrine) & Actifed ** use only after [redacted] weeks gestation and if you do not have high blood pressure Tylenol Vicks Vaporub Zinc lozenges Zyrtec   Constipation: Colace Ducolax suppositories Fleet enema Glycerin suppositories Metamucil Milk of magnesia Miralax Senokot Smooth move tea  Diarrhea: Kaopectate Imodium A-D  *NO pepto  Bismol  Hemorrhoids: Anusol Anusol HC Preparation H Tucks  Indigestion: Tums Maalox Mylanta Zantac  Pepcid  Insomnia: Benadryl (alcohol free) 25mg every 6 hours as needed Tylenol PM Unisom, no Gelcaps  Leg Cramps: Tums MagGel  Nausea/Vomiting:  Bonine Dramamine Emetrol Ginger extract Sea bands Meclizine  Nausea medication to take during pregnancy:  Unisom (doxylamine succinate 25 mg tablets) Take one tablet daily at bedtime. If symptoms are not adequately controlled, the dose can be increased to a maximum recommended dose of two tablets daily (1/2 tablet in the morning, 1/2 tablet mid-afternoon and one at bedtime). Vitamin B6 100mg tablets. Take one tablet twice a day (up to 200 mg per day).  Skin Rashes: Aveeno products Benadryl cream or 25mg every 6 hours as needed Calamine Lotion 1% cortisone cream  Yeast infection: Gyne-lotrimin 7 Monistat 7   **If taking multiple medications, please check labels to avoid duplicating the same active ingredients **take medication as directed on the label ** Do not exceed 4000 mg of tylenol in 24 hours **Do not take medications that contain aspirin or ibuprofen    

## 2022-10-13 NOTE — MAU Provider Note (Signed)
History     CSN: [id number]  Arrival date and time: 10/13/22 5681   Event Date/Time   First Provider Initiated Contact with Patient 10/13/22 2106      Chief Complaint  Patient presents with   Abdominal Pain   Vaginal Bleeding   Ana Moran , a  26 y.o. G1P0 at Unknown presents to MAU with complaints of lower abdominal cramping since Saturday. Patient denies attempting to relieve pain and discomfort. Currently rates pain a 7/10. Patient also endorses a one-time incidence of spotting with wiping. She denies bright red vaginal bleeding, saturating more than a pad per hour and passing clots. She denies vaginal itching or burning with urination but noted painful intercourse. Patient has a history of an ectopic pregnancy and states she is anxious and nervous of the results today. Patient states she was previously on anxiety and depression medication but states she stopped when thought she might be pregnant. Patient states "I know my heart rate is going to be up d/t nerves." Patient also endorses not eating or drinking today due to nerves.    Due to MAU acuity, orders placed from triage.         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[facility])     Past Surgical History:  Procedure Laterality Date   ANKLE SURGERY     GANGLION CYST EXCISION Left 11/24/2017   Procedure: REMOVAL GANGLION CYST ANKLE;  Surgeon: Corky Mull, MD;  Location: [place];  Service: Orthopedics;  Laterality: Left;   KNEE ARTHROSCOPY WITH MENISCAL REPAIR Left 11/14/2015   Procedure: KNEE ARTHROSCOPY WITH partial synovectomy;  Surgeon: Corky Mull, MD;  Location: [facility];  Service: Orthopedics;  Laterality: Left;    Family History  Problem Relation Age  of Onset   Breast cancer Maternal Aunt     Social History   Tobacco Use   Smoking status: Never   Smokeless tobacco: Never  Vaping Use   Vaping Use: Never used  Substance Use Topics   Alcohol use: No   Drug use: No    Allergies:  Allergies  Allergen Reactions   Strawberry (Diagnostic) Hives and Shortness Of Breath    Medications Prior to Admission  Medication Sig Dispense Refill Last Dose   albuterol (PROVENTIL HFA;VENTOLIN HFA) 108 (90 Base) MCG/ACT inhaler Inhale 2 puffs into the lungs every 6 (six) hours as needed for wheezing or shortness of breath. 1 Inhaler 0    PARoxetine (PAXIL) 10 MG tablet Take 10 mg by mouth daily.       Review of Systems  Constitutional:  Negative for chills, fatigue and fever.  Eyes:  Negative for pain and visual disturbance.  Respiratory:  Negative for apnea, shortness of breath and wheezing.   Cardiovascular:  Negative for chest pain and palpitations.  Gastrointestinal:  Negative for abdominal pain, constipation, diarrhea, nausea and vomiting.  Genitourinary:  Positive for pelvic pain and vaginal  bleeding. Negative for difficulty urinating, dysuria, vaginal discharge and vaginal pain.  Musculoskeletal:  Negative for back pain.  Neurological:  Negative for seizures, weakness and headaches.  Psychiatric/Behavioral:  Negative for suicidal ideas. The patient is nervous/anxious.    Physical Exam   Blood pressure (!) 146/79, pulse (!) 133, temperature 98.1 F (36.7 C), resp. rate 17, height '5\' 4"'$  (1.626 m), weight 65.3 kg, last menstrual period 09/02/2022, SpO2 100 %.  Physical Exam Vitals and nursing note reviewed.  Constitutional:      General: She is not in acute distress.    Appearance: Normal appearance.  HENT:     Head: Normocephalic.  Pulmonary:     Effort: Pulmonary effort is normal.  Abdominal:     Palpations: Abdomen is soft.  Musculoskeletal:     Cervical back: Normal range of motion.  Skin:    General: Skin is warm and  dry.     Capillary Refill: Capillary refill takes less than 2 seconds.  Neurological:     Mental Status: She is alert and oriented to person, place, and time.  Psychiatric:        Mood and Affect: Mood is anxious.     Comments: Patient visibly anxious.      MAU Course  Procedures Orders Placed This Encounter  Procedures   Wet prep, genital   US OB LESS THAN 14 WEEKS WITH OB TRANSVAGINAL   Urinalysis, Routine w reflex microscopic Urine, Clean Catch   CBC   hCG, quantitative, pregnancy   HIV Antibody (routine testing w rflx)   Diet NPO time specified   Pregnancy, urine POC   ABO/Rh   Discharge patient   Results for orders placed or performed during the hospital encounter of 10/13/22 (from the past 24 hour(s))  Urinalysis, Routine w reflex microscopic Urine, Clean Catch     Status: Abnormal   Collection Time: 10/13/22  7:59 PM  Result Value Ref Range   Color, Urine YELLOW YELLOW   APPearance CLEAR CLEAR   Specific Gravity, Urine 1.028 1.005 - 1.030   pH 5.0 5.0 - 8.0   Glucose, UA NEGATIVE NEGATIVE mg/dL   Hgb urine dipstick NEGATIVE NEGATIVE   Bilirubin Urine NEGATIVE NEGATIVE   Ketones, ur 80 (A) NEGATIVE mg/dL   Protein, ur NEGATIVE NEGATIVE mg/dL   Nitrite NEGATIVE NEGATIVE   Leukocytes,Ua NEGATIVE NEGATIVE  Pregnancy, urine POC     Status: Abnormal   Collection Time: 10/13/22  8:00 PM  Result Value Ref Range   Preg Test, Ur POSITIVE (A) NEGATIVE  Wet prep, genital     Status: Abnormal   Collection Time: 10/13/22  8:10 PM   Specimen: PATH Cytology Cervicovaginal Ancillary Only  Result Value Ref Range   Yeast Wet Prep HPF POC PRESENT (A) NONE SEEN   Trich, Wet Prep NONE SEEN NONE SEEN   Clue Cells Wet Prep HPF POC NONE SEEN NONE SEEN   WBC, Wet Prep HPF POC <10 <10   Sperm NONE SEEN   CBC     Status: None   Collection Time: 10/13/22  8:16 PM  Result Value Ref Range   WBC 7.7 4.0 - 10.5 K/uL   RBC 4.20 3.87 - 5.11 MIL/uL   Hemoglobin 12.6 12.0 - 15.0 g/dL    HCT 36.0 36.0 - 46.0 %   MCV 85.7 80.0 - 100.0 fL   MCH 30.0 26.0 - 34.0 pg   MCHC 35.0 30.0 - 36.0 g/dL   RDW 12.1 11.5 - 15.5 %  Platelets 292 150 - 400 K/uL   nRBC 0.0 0.0 - 0.2 %  ABO/Rh     Status: None   Collection Time: 10/13/22  8:16 PM  Result Value Ref Range   ABO/RH(D) O POS    No rh immune globuloin      NOT A RH IMMUNE GLOBULIN CANDIDATE, PT RH POSITIVE Performed at [facility] [address] [place], [place] [address]   hCG, quantitative, pregnancy     Status: Abnormal   Collection Time: 10/13/22  8:16 PM  Result Value Ref Range   hCG, Beta Chain, Quant, S 28,777 (H) <5 mIU/mL   US OB LESS THAN 14 WEEKS WITH OB TRANSVAGINAL  Result Date: 10/13/2022 CLINICAL DATA:  Left lower quadrant pain, history of ectopic pregnancy. EXAM: OBSTETRIC <14 WK [place] AND TRANSVAGINAL OB US TECHNIQUE: Both transabdominal and transvaginal ultrasound examinations were performed for complete evaluation of the gestation as well as the maternal uterus, adnexal regions, and pelvic cul-de-sac. Transvaginal technique was performed to assess early pregnancy. COMPARISON:  Pelvic ultrasound 01/02/2019 FINDINGS: Intrauterine gestational sac: Single Yolk sac:  Visualized. Embryo:  Not Visualized. Cardiac Activity: Not Visualized. Heart Rate: N/a bpm MSD: 11.2  mm   5 w   6  d Subchorionic hemorrhage:  None visualized. Maternal uterus/adnexae: The right ovary is normal measuring 3.2 cm x 4.0 cm x 3.4 cm. The left ovary is normal measuring 2.8 cm x 1.6 cm x 2.2 cm. A 1.3 cm left paraovarian cyst is noted, not significantly changed since 2021. No specific imaging follow-up is required. There is small volume free fluid in the pelvis. IMPRESSION: 1. Early intrauterine gestational sac with yolk sac, but no fetal pole or cardiac activity yet visualized. Recommend follow-up quantitative B-HCG levels and follow-up US in 14 days to assess viability. This recommendation follows SRU consensus guidelines: Diagnostic  Criteria for Nonviable Pregnancy Early in the First Trimester. [employer] Med 2013; 786:7672-09. 2. Small volume free fluid in the pelvis. Electronically Signed   By: Valetta Mole M.D.   On: 10/13/2022 20:57     Lab results reviewed and interpreted by me.   MDM - Wet prep positive for yeast.  - CBC normal  - HGC > 28,000 - UA positive for 80 of Ketones, but patient stated unable to eat of drink today for anxiousness.  - US revealed Gestational sac within the uterus. Low suspicion for ectopic pregnancy.  - Recommendation for Viability [place] in 7-10 days  - Plan for discharge  Assessment and Plan   1. Less than [redacted] weeks gestation of pregnancy   2. Abdominal cramping   3. [redacted] weeks gestation of pregnancy   4. Yeast infection    - Discussed that both dehydration and a yeast infection can be the cause of pelvic cramping and discomfort. - Rx for Terazole 7 sent to outpatient pharmacy   - Also informed patient of [place] results and patient began to relax some. Recommended that patient be seen at [facility] for viability [place] in 7-14 days. Message sent to [facility] to get patient scheduled.  - Recommended to began any OTC prenatal vitamin with folic acid.  - Strict bleeding and early pregnancy precautions reviewed.  - List of prenatal providers and safe meds in pregnancy list provided.  - Worsening signs and return precautions discussed.  - Patient discharged home in stable condition and may return to MAU as needed.   Jacquiline Doe, MSN CNM  10/13/2022, 9:07 PM  RN notified CNM of patient still high BP and pulse at time of discharge. Patient Vitals for the past 24 hrs:  BP Temp Pulse Resp SpO2 Height Weight  10/13/22 2137 (!) 140/76 -- (!) 130 -- 100 % -- --  10/13/22 1931 (!) 146/79 -- -- -- -- -- --  10/13/22 1930 -- 98.1 F (36.7 C) (!) 133 17 100 % '5\' 4"'$  (1.626 m) 65.3 kg   Patient reassured RN that she does not typically present like this and that she is just "anxious."

## 2022-10-13 NOTE — MAU Note (Signed)
.  Ana Moran is a 26 y.o. at Unknown here in MAU reporting: menstrual like cramps since Sat. Tonight having occ sharp pains on LLQ. Hx ectopic requiring surgery on L tube. Tonight when she wiped she saw blood on one occasion. States she is very nervous so she has not had anything to eat or drink today.  LMP: 10/11 Onset of complaint: Sat Pain score: 7 Vitals:   10/13/22 1930 10/13/22 1931  BP:  (!) 146/79  Pulse: (!) 133   Resp: 17   Temp: 98.1 F (36.7 C)   SpO2: 100%      FHT:n/a Lab orders placed from triage:  upt andu/a

## 2022-10-13 NOTE — MAU Note (Signed)
Gailen Shelter CNM in Family Rm to see pt and discuss test results

## 2022-10-14 ENCOUNTER — Ambulatory Visit: Payer: Medicaid Other

## 2022-10-14 ENCOUNTER — Other Ambulatory Visit: Payer: Self-pay | Admitting: *Deleted

## 2022-10-14 DIAGNOSIS — O3680X0 Pregnancy with inconclusive fetal viability, not applicable or unspecified: Secondary | ICD-10-CM

## 2022-10-14 LAB — GC/CHLAMYDIA PROBE AMP (~~LOC~~) NOT AT ARMC
Chlamydia: NEGATIVE
Comment: NEGATIVE
Comment: NORMAL
Neisseria Gonorrhea: NEGATIVE

## 2022-10-14 NOTE — Addendum Note (Signed)
Addended by: Samuel Germany on: 10/14/2022 04:06 PM   Modules accepted: Orders

## 2022-10-27 ENCOUNTER — Other Ambulatory Visit: Payer: Medicaid Other

## 2022-11-03 ENCOUNTER — Other Ambulatory Visit: Payer: Self-pay | Admitting: Certified Nurse Midwife

## 2022-11-03 ENCOUNTER — Ambulatory Visit (INDEPENDENT_AMBULATORY_CARE_PROVIDER_SITE_OTHER): Payer: Medicaid Other

## 2022-11-03 ENCOUNTER — Ambulatory Visit (INDEPENDENT_AMBULATORY_CARE_PROVIDER_SITE_OTHER): Payer: Self-pay | Admitting: Advanced Practice Midwife

## 2022-11-03 ENCOUNTER — Encounter: Payer: Self-pay | Admitting: Advanced Practice Midwife

## 2022-11-03 DIAGNOSIS — Z3A08 8 weeks gestation of pregnancy: Secondary | ICD-10-CM

## 2022-11-03 DIAGNOSIS — O3680X0 Pregnancy with inconclusive fetal viability, not applicable or unspecified: Secondary | ICD-10-CM

## 2022-11-03 DIAGNOSIS — F411 Generalized anxiety disorder: Secondary | ICD-10-CM

## 2022-11-03 DIAGNOSIS — Z3491 Encounter for supervision of normal pregnancy, unspecified, first trimester: Secondary | ICD-10-CM

## 2022-11-03 MED ORDER — SERTRALINE HCL 25 MG PO TABS: 25.0000 mg | ORAL_TABLET | Freq: Every day | ORAL | 6 refills | Status: AC

## 2022-11-03 NOTE — Progress Notes (Unsigned)
Ultrasounds Results Note  SUBJECTIVE HPI:  Ms. Ana Moran is a 26 y.o. G2P0010 at 85w6dby LMP who [facility]. The patient denies abdominal pain or vaginal bleeding.  Upon review of the patient's records, patient was first seen in MAU on 10/13/22 for abd pain and bleeding.      Previous [place]+ GS and YS. No FP  --/--/O POS (11/21 2016)  Repeat ultrasound was performed earlier today.   Past Medical History:  Diagnosis Date   Inverted nipple    Motion sickness    cars   Prolactinoma ([facility]    Past Surgical History:  Procedure Laterality Date   ANKLE SURGERY     GANGLION CYST EXCISION Left 11/24/2017   Procedure: REMOVAL GANGLION CYST ANKLE;  Surgeon: PCorky Mull MD;  [place] [facility]  Service: Orthopedics;  Laterality: Left;   KNEE ARTHROSCOPY WITH MENISCAL REPAIR Left 11/14/2015   Procedure: KNEE ARTHROSCOPY WITH partial synovectomy;  Surgeon: JCorky Mull MD;  Location: [facility];  Service: Orthopedics;  Laterality: Left;   Social History   Socioeconomic History   Marital status: Single    Spouse name: Not on file   Number of children: Not on file   Years of education: Not on file   Highest education level: Not on file  Occupational History   Not on file  Tobacco Use   Smoking status: Never   Smokeless tobacco: Never  Vaping Use   Vaping Use: Never used  Substance and Sexual Activity   Alcohol use: No   Drug use: No   Sexual activity: Not on file  Other Topics Concern   Not on file  Social History Narrative   Not on file   Social Determinants of Health   Financial Resource Strain: Not on file  Food Insecurity: Not on file  Transportation Needs: Not on file  Physical Activity: Not on file  Stress: Not on file  Social Connections: Not on file  Intimate Partner Violence: Not on file   Current Outpatient Medications on File Prior to Visit  Medication Sig Dispense Refill    albuterol (PROVENTIL HFA;VENTOLIN HFA) 108 (90 Base) MCG/ACT inhaler Inhale 2 puffs into the lungs every 6 (six) hours as needed for wheezing or shortness of breath. 1 Inhaler 0   terconazole (TERAZOL 7) 0.4 % vaginal cream Place 1 applicator vaginally at bedtime. Use for seven days 45 g 0   [DISCONTINUED] pantoprazole (PROTONIX) 20 MG tablet Take 1 tablet (20 mg total) by mouth daily. 30 tablet 1   No current facility-administered medications on file prior to visit.   Allergies  Allergen Reactions   Strawberry (Diagnostic) Hives and Shortness Of Breath    I have reviewed patient's Past Medical Hx, Surgical Hx, Family Hx, Social Hx, medications and allergies.   Review of Systems Review of Systems  Constitutional: Negative for fever and chills.  Gastrointestinal: Negative for abdominal pain.  Genitourinary: Negative for vaginal bleeding.  Musculoskeletal: Negative for back pain.  Neurological: Negative for dizziness and weakness.    Physical Exam  LMP 09/02/2022 (Exact Date)   Patient's last menstrual period was 09/02/2022 (exact date). GENERAL: Well-developed, well-nourished female in no acute distress.  HEENT: Normocephalic, atraumatic.   LUNGS: Effort normal ABDOMEN: Deferred HEART: Regular rate  SKIN: Warm, dry and without erythema PSYCH: Normal mood and affect NEURO: Alert and oriented x 4  LAB RESULTS No results found for this or any  previous visit (from the past 24 hour(s)).  IMAGING 8.6 week live [place] 1. Generalized anxiety disorder   2. [redacted] weeks gestation of pregnancy   3. Normal IUP (intrauterine pregnancy) on prenatal ultrasound, first trimester     PLAN Start prenatal care with provider of your choice List of Ob/Gyn providers given. Patient advised to start/continue taking prenatal vitamins Go to MAU as needed for heavy bleeding, abdominal pain or fever greater than 100.4.  [place], [place] 11/03/2022 12:19 PM

## 2022-11-03 NOTE — Patient Instructions (Addendum)
[facility] Providers   [facility]             [address], [place], [place] [address] [phone]  [facility]                                                             [address], [address], [place], [place], [address] [phone]  [facility] [place]                                    [address], [address], [place], [place], [address] [phone]  [facility] [place] [address], [place], [place], [address] [phone]  [facility]                                 [address], [place], [place], [address] [phone]  [facility]                                    [address], [place], [place], [address] [phone]  [facility] [address], [address], [place], [place], [address]                              [facility] [address], [place], [place], [address] [phone]  [facility]         Phone: [phone]  [facility]      Phone: [phone]   [facility]      Phone: [phone]  [facility]         Phone: [phone]   [facility]    Phone: [phone]  [facility]      Phone: [phone]  [facility]     Phone: [phone]  [facility]        Phone: [phone]  [facility] (Sheronette Cousins) [phone]  [facility]      Phone: [phone]    CenteringPregnancy is a model of prenatal care that started 30 years ago and is used in about 600 practices around the [place]. You meet with a group of 8-12 women due around the  same time as you. In Centering you will have individual time with the provider and meet as a group. There's much more time for discussion and learning. You will actually have much more time with your provider in Centering than in traditional prenatal care.? You will come directly into the Centering room and will not wait in the lobby so there is no wasted time. You will have 2-hour visits every 4 weeks then every 2 weeks. You will know your Centering prenatal appointments in advance. In your last month of pregnancy, you may also come in for some   individual visits. Additional appointments can be scheduled if you need more care. Studies have shown that CenteringPregnancy improves birth outcomes. We have seen especially big improvements in fewer Black women delivering babies who are too small or born too early. Visit the website [facility] for more information. Let your provider or clinic staff know if you want to sign up.       

## 2022-11-07 ENCOUNTER — Encounter: Payer: Self-pay | Admitting: Advanced Practice Midwife

## 2022-11-09 NOTE — BH Specialist Note (Signed)
Integrated Behavioral Health via Telemedicine Visit  11/19/2022 [name] [id number]  Number of [place] Clinician visits: 1- Initial Visit  Session Start time: 1516   Session End time: 3329  Total time in minutes: 41   Referring Provider: Manya Silvas, CNM Patient/Family location[facility] Provider location: [facility] [employer] [facility]  All persons participating in visit: Patient Ana Moran and [place]   Types of Service: Individual psychotherapy and Video visit  I connected with [name] and/or Patrcia Dolly Seibert's  n/a  via  Telephone or Video Enabled Telemedicine Application  (Video is Caregility application) and verified that I am speaking with the correct person using two identifiers. Discussed confidentiality: Yes   I discussed the limitations of telemedicine and the availability of in person appointments.  Discussed there is a possibility of technology failure and discussed alternative modes of communication if that failure occurs.  I discussed that engaging in this telemedicine visit, they consent to the provision of behavioral healthcare and the services will be billed under their insurance.  Patient and/or legal guardian expressed understanding and consented to Telemedicine visit: Yes   Presenting Concerns: Patient and/or family reports the following symptoms/concerns: Anxiety increasing in specific situations (around certain people; in hospital or medical office/ blood pressure goes up with anxiety); noticing "heart going crazy" while exercising at times. Pt states being in hospital worries her as it may remind her of hospitalization after ectopic pregnancy. Pt plans to begin taking Zoloft this week and open to implementing self-coping strategy to help manage symptoms.  Duration of problem: Increase in pregnancy; Severity of problem: moderate  Patient and/or  Family's Strengths/Protective Factors: Social connections, Concrete supports in place (healthy food, safe environments, etc.), and Sense of purpose  Goals Addressed: Patient will:  Reduce symptoms of: anxiety   Increase knowledge and/or ability of: self-management skills   Demonstrate ability to: Increase healthy adjustment to current life circumstances  Progress towards Goals: Ongoing  Interventions: Interventions utilized:  Mindfulness or Psychologist, educational, Psychoeducation and/or Health Education, and Link to [employer] Standardized Assessments completed: GAD-7 and PHQ 9  Patient and/or Family Response: Patient agrees with treatment plan.   Assessment: Patient currently experiencing Generalized anxiety disorder .   Patient may benefit from psychoeducation and brief therapeutic interventions regarding coping with symptoms of anxiety .  Plan: Follow up with behavioral health clinician on : One month; Call Orlen Leedy at [phone], as needed. Behavioral recommendations:  -Begin taking Zoloft as prescribed -CALM relaxation breathing exercise twice daily (morning; at bedtime); as needed throughout the day (priority: when noticing when jaws are clenched) -Consider viewing virtual tour of hospital at [email or web address]  Referral(s): [place] (In Clinic)  I discussed the assessment and treatment plan with the patient and/or parent/guardian. They were provided an opportunity to ask questions and all were answered. They agreed with the plan and demonstrated an understanding of the instructions.   They were advised to call back or seek an in-person evaluation if the symptoms worsen or if the condition fails to improve as anticipated.  Garlan Fair, LCSW     11/19/2022    3:19 PM  Depression screen PHQ 2/9  Decreased Interest 2  Down, Depressed, Hopeless 3  PHQ - 2 Score 5  Altered sleeping 0  Tired, decreased energy 3  Change in  appetite 3  Feeling bad or failure about yourself  2  Trouble concentrating 0  Moving slowly or  fidgety/restless 0  Suicidal thoughts 0  PHQ-9 Score 13      11/19/2022    3:24 PM  GAD 7 : Generalized Anxiety Score  Nervous, Anxious, on Edge 1  Control/stop worrying 2  Worry too much - different things 2  Trouble relaxing 0  Restless 0  Easily annoyed or irritable 2  Afraid - awful might happen 3  Total GAD 7 Score 10

## 2022-11-19 ENCOUNTER — Ambulatory Visit (INDEPENDENT_AMBULATORY_CARE_PROVIDER_SITE_OTHER): Payer: Medicaid Other | Admitting: Clinical

## 2022-11-19 DIAGNOSIS — F411 Generalized anxiety disorder: Secondary | ICD-10-CM | POA: Diagnosis not present

## 2022-11-19 NOTE — Patient Instructions (Signed)
[facility] for [facility] [place], [place] [address] [phone] (main office) [phone] (Orabelle Rylee's office)  [facility] [email or web address]

## 2022-11-30 NOTE — BH Specialist Note (Signed)
Integrated Behavioral Health via Telemedicine Visit  12/14/2022 Ana Moran [id number]  Number of Integrated Behavioral Health Clinician visits: 2- Second Visit  Session Start time: 7412   Session End time: 8786  Total time in minutes: 24   Referring Provider: Manya Silvas, CNM Patient/Family location: Home [facility] Provider location: Center for [employer] at [facility]  All persons participating in visit: Patient Ana Moran and [place]   Types of Service: Individual psychotherapy and Video visit  I connected with Ana Moran and/or Ana Moran's  n/a  via  Telephone or Video Enabled Telemedicine Application  (Video is Caregility application) and verified that I am speaking with the correct person using two identifiers. Discussed confidentiality: Yes   I discussed the limitations of telemedicine and the availability of in person appointments.  Discussed there is a possibility of technology failure and discussed alternative modes of communication if that failure occurs.  I discussed that engaging in this telemedicine visit, they consent to the provision of behavioral healthcare and the services will be billed under their insurance.  Patient and/or legal guardian expressed understanding and consented to Telemedicine visit: Yes   Presenting Concerns: Patient and/or family reports the following symptoms/concerns: Poor appetite and weight loss, attributed to nausea in pregnancy; has not begun taking Zoloft due to unable to keep food down; goal is healthy pregnancy and manage anxiety.  Duration of problem: Current pregnancy; Severity of problem: moderate  Patient and/or Family's Strengths/Protective Factors: Social connections, Concrete supports in place (healthy food, safe environments, etc.), and Sense of purpose  Goals Addressed: Patient will:  Reduce symptoms of: anxiety   Increase knowledge and/or  ability of: healthy habits   Demonstrate ability to: Increase healthy adjustment to current life circumstances and Increase motivation to adhere to plan of care  Progress towards Goals: Ongoing  Interventions: Interventions utilized:  Solution-Focused Strategies Standardized Assessments completed: Not Needed  Patient and/or Family Response: Patient agrees with treatment plan.   Assessment: Patient currently experiencing Generalized anxiety disorder.   Patient may benefit from continued therapeutic interventions.  Plan: Follow up with behavioral health clinician on : One month Behavioral recommendations:  -Pick up new Zofran (disintegrating tablet) at pharmacy today and begin taking as prescribed -Set timers on phone to go off as reminders to take Zofran every 6 hours for the next 48 hours; if this helps reduce nausea, continue using timers -Continue using relaxation breathing as needed daily; eating and drinking as able throughout the day -Continue to consider doula services (read through information on After Visit Summary Referral(s): [facility] (In Clinic)  I discussed the assessment and treatment plan with the patient and/or parent/guardian. They were provided an opportunity to ask questions and all were answered. They agreed with the plan and demonstrated an understanding of the instructions.   They were advised to call back or seek an in-person evaluation if the symptoms worsen or if the condition fails to improve as anticipated.  [name] Tamico Mundo, LCSW     12/09/2022    4:32 PM 11/19/2022    3:19 PM  Depression screen PHQ 2/9  Decreased Interest 2 2  Down, Depressed, Hopeless 1 3  PHQ - 2 Score 3 5  Altered sleeping 2 0  Tired, decreased energy 2 3  Change in appetite 1 3  Feeling bad or failure about yourself  1 2  Trouble concentrating 0 0  Moving slowly or fidgety/restless 0 0  Suicidal thoughts 0  0  PHQ-9 Score 9 13      12/09/2022     4:32 PM 11/19/2022    3:24 PM  GAD 7 : Generalized Anxiety Score  Nervous, Anxious, on Edge 2 1  Control/stop worrying 1 2  Worry too much - different things 1 2  Trouble relaxing 0 0  Restless 0 0  Easily annoyed or irritable 1 2  Afraid - awful might happen 0 3  Total GAD 7 Score 5 10

## 2022-12-03 ENCOUNTER — Telehealth (INDEPENDENT_AMBULATORY_CARE_PROVIDER_SITE_OTHER): Payer: Medicaid Other

## 2022-12-03 DIAGNOSIS — Z3689 Encounter for other specified antenatal screening: Secondary | ICD-10-CM

## 2022-12-03 DIAGNOSIS — R112 Nausea with vomiting, unspecified: Secondary | ICD-10-CM

## 2022-12-03 DIAGNOSIS — Z3480 Encounter for supervision of other normal pregnancy, unspecified trimester: Secondary | ICD-10-CM

## 2022-12-03 MED ORDER — ONDANSETRON HCL 4 MG PO TABS: 4.0000 mg | ORAL_TABLET | Freq: Three times a day (TID) | ORAL | 0 refills | Status: AC | PRN

## 2022-12-03 MED ORDER — BLOOD PRESSURE MONITORING DEVI: 1.0000 | 0 refills | Status: AC

## 2022-12-03 NOTE — Patient Instructions (Addendum)
[facility]  Located in: [facility] Address: [address], [place], [place] [address] Hours:  Open ? Closes 6?PM Phone: [phone]  Safe Medications in Pregnancy   Acne:  Benzoyl Peroxide  Salicylic Acid   Backache/Headache:  Tylenol: 2 regular strength every 4 hours OR               2 Extra strength every 6 hours   Colds/Coughs/Allergies:  Benadryl (alcohol free) 25 mg every 6 hours as needed  Breath right strips  Claritin  Cepacol throat lozenges  Chloraseptic throat spray  Cold-Eeze- up to three times per day  Cough drops, alcohol free  Flonase (by prescription only)  Guaifenesin  Mucinex  Robitussin DM (plain only, alcohol free)  Saline nasal spray/drops  Sudafed (pseudoephedrine) & Actifed * use only after [redacted] weeks gestation and if you do not have high blood pressure  Tylenol  Vicks Vaporub  Zinc lozenges  Zyrtec   Constipation:  Colace  Ducolax suppositories  Fleet enema  Glycerin suppositories  Metamucil  Milk of magnesia  Miralax  Senokot  Smooth move tea   Diarrhea:  Kaopectate  Imodium A-D   *NO pepto Bismol   Hemorrhoids:  Anusol  Anusol HC  Preparation H  Tucks   Indigestion:  Tums  Maalox  Mylanta  Zantac  Pepcid   Insomnia:  Benadryl (alcohol free) '25mg'$  every 6 hours as needed  Tylenol PM  Unisom, no Gelcaps   Leg Cramps:  Tums  MagGel   Nausea/Vomiting:  Bonine  Dramamine  Emetrol  Ginger extract  Sea bands  Meclizine  Nausea medication to take during pregnancy:  Unisom (doxylamine succinate 25 mg tablets) Take one tablet daily at bedtime. If symptoms are not adequately controlled, the dose can be increased to a maximum recommended dose of two tablets daily (1/2 tablet in the morning, 1/2 tablet mid-afternoon and one at bedtime).  Vitamin B6 '100mg'$  tablets. Take one tablet twice a day (up to 200 mg per day).   Skin Rashes:  Aveeno products  Benadryl cream or '25mg'$  every 6 hours as  needed  Calamine Lotion  1% cortisone cream   Yeast infection:  Gyne-lotrimin 7  Monistat 7    **If taking multiple medications, please check labels to avoid duplicating the same active ingredients  **take medication as directed on the label  ** Do not exceed 4000 mg of tylenol in 24 hours  **Do not take medications that contain aspirin or ibuprofen

## 2022-12-03 NOTE — Progress Notes (Signed)
New OB Intake  I connected withNAME@  on 12/03/22 at 10:15 AM EST by MyChart Video Visit and verified that I am speaking with the correct person using two identifiers. Nurse is located at [facility] and pt is located at home.  I discussed the limitations, risks, security and privacy concerns of performing an evaluation and management service by telephone and the availability of in person appointments. I also discussed with the patient that there may be a patient responsible charge related to this service. The patient expressed understanding and agreed to proceed.  I explained I am completing New OB Intake today. We discussed EDD of 06/09/23 that is based on [place] on 11/03/22. Pt is G2/P0. I reviewed her allergies, medications, Medical/Surgical/OB history, and appropriate screenings. I informed her of [facility] services. [facility] information placed in AVS. Based on history, this is a low risk pregnancy.  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 Patient given information for [facility] Healthy Baby website for more information about Women's and [place]. Patient is not interested in water birth. Offered upcoming OB visit with CNM to discuss further.  MyChart/Babyscripts MyChart access verified. I explained pt will have some visits in office and some virtually. Babyscripts instructions given and order placed. Patient verifies receipt of registration text/e-mail. Account successfully created and app downloaded.  Blood Pressure Cuff/Weight Scale Blood pressure cuff ordered for patient to pick-up from [employer]. Explained after first prenatal appt pt will check weekly and document in 71. Patient does not have weight scale; order sent to [place], patient may track weight weekly in  Babyscripts.  Anatomy US Explained first scheduled [place] will be around 19 weeks. Anatomy US scheduled for 01/12/23 at 0100p. Pt notified to arrive at 1245p.  Labs Discussed Johnsie Cancel genetic screening with patient. Would like both Panorama and Horizon drawn at new OB visit. Routine prenatal labs needed.  COVID Vaccine Patient has not had COVID vaccine.   Is patient a CenteringPregnancy candidate?  NA Declined due to Enrolled in [facility] Not a candidate due to  NA If accepted,    Is patient a Mom+Baby Combined Care candidate?  Accepted   If accepted, Mom+Baby staff notified  Social Determinants of Health Food Insecurity: Patient denies food insecurity. WIC Referral: Patient is interested in referral to [facility].  Transportation: Patient denies transportation needs. Childcare: Discussed no children allowed at ultrasound appointments. Offered childcare services; patient declines childcare services at this time.  First visit review I reviewed new OB appt with patient. I explained they will have a provider visit that includes . Explained pt will be seen by Dr. Ernestina Patches at first visit; encounter routed to appropriate provider. Explained that patient will be seen by pregnancy navigator following visit with provider.   Bethanne Ginger, [place] 12/03/2022  10:28 AM

## 2022-12-03 NOTE — Progress Notes (Signed)
Pt mentioned that she's still having some white discharge , was treated previously for Yeast. Advised that we can do another Wet prep just to make sure everything has cleared.

## 2022-12-04 NOTE — Progress Notes (Signed)
Attestation of Attending Supervision of clinical support staff: I agree with the care provided to this patient and was available for any consultation.  I have reviewed the CMA's note and chart, and I agree with the management and plan.  Kortlynn Poust MD MPH, ABFM Attending Physician Faculty Practice- [facility]  

## 2022-12-09 ENCOUNTER — Encounter: Payer: Self-pay | Admitting: Family Medicine

## 2022-12-09 ENCOUNTER — Ambulatory Visit (INDEPENDENT_AMBULATORY_CARE_PROVIDER_SITE_OTHER): Payer: Medicaid Other | Admitting: Family Medicine

## 2022-12-09 ENCOUNTER — Other Ambulatory Visit (HOSPITAL_COMMUNITY)
Admission: RE | Admit: 2022-12-09 | Discharge: 2022-12-09 | Disposition: A | Discharge status: Home / Self Care | Payer: Medicaid Other | Source: Ambulatory Visit | Attending: Family Medicine | Admitting: Family Medicine

## 2022-12-09 VITALS — BP 136/75 | HR 147 | Wt 143.1 lb

## 2022-12-09 DIAGNOSIS — Z3480 Encounter for supervision of other normal pregnancy, unspecified trimester: Secondary | ICD-10-CM | POA: Diagnosis present

## 2022-12-09 DIAGNOSIS — Z3482 Encounter for supervision of other normal pregnancy, second trimester: Secondary | ICD-10-CM | POA: Insufficient documentation

## 2022-12-09 DIAGNOSIS — Z23 Encounter for immunization: Secondary | ICD-10-CM

## 2022-12-09 DIAGNOSIS — Z3A14 14 weeks gestation of pregnancy: Secondary | ICD-10-CM | POA: Diagnosis not present

## 2022-12-09 DIAGNOSIS — F419 Anxiety disorder, unspecified: Secondary | ICD-10-CM

## 2022-12-09 MED ORDER — ONDANSETRON 4 MG PO TBDP: 4.0000 mg | ORAL_TABLET | Freq: Four times a day (QID) | ORAL | 1 refills | Status: AC | PRN

## 2022-12-09 NOTE — Progress Notes (Signed)
INITIAL PRENATAL VISIT  Subjective:   Ana Moran is being seen today for her first obstetrical visit.  This is a planned pregnancy. This is a desired pregnancy.  She is at 91w0dgestation by LMP. Her obstetrical history is significant for  anxiety, h/o ectopic and hyperprolactinoma . Relationship with FOB: significant other, living together. Patient does intend to breast feed. Pregnancy history fully reviewed.  Patient reports no complaints.  Indications for ASA therapy (per uptodate) One of the following: Previous pregnancy with preeclampsia, especially early onset and with an adverse outcome No Multifetal gestation No Chronic hypertension No Type 1 or 2 diabetes mellitus No Chronic kidney disease No Autoimmune disease (antiphospholipid syndrome, systemic lupus erythematosus) No  Two or more of the following: Nulliparity Yes Obesity (body mass index >30 kg/m2) No Family history of preeclampsia in mother or sister No Age ?35 years No Sociodemographic characteristics (African American race, low socioeconomic level) Yes Personal risk factors (eg, previous pregnancy with low birth weight or small for gestational age infant, previous adverse pregnancy outcome [eg, stillbirth], interval >10 years between pregnancies) No  Indications for early GDM screening  First-degree relative with diabetes Yes BMI >30kg/m2 No Age > 25 Yes Previous birth of an infant weighing ?4000 g No Gestational diabetes mellitus in a previous pregnancy No Glycated hemoglobin ?5.7 percent (39 mmol/mol), impaired glucose tolerance, or impaired fasting glucose on previous testing No High-risk race/ethnicity (eg, African American, Latino, Native American, Asian American, Pacific Islander) Yes Previous stillbirth of unknown cause No Maternal birthweight > 9 lbs No History of cardiovascular disease No Hypertension or on therapy for hypertension No High-density lipoprotein cholesterol level <35 mg/dL  (0.90 mmol/L) and/or a triglyceride level >250 mg/dL (2.82 mmol/L) No Polycystic ovary syndrome No Physical inactivity No Other clinical condition associated with insulin resistance (eg, severe obesity, acanthosis nigricans) No Current use of glucocorticoids No   Early screening tests: FBS, A1C, Random CBG, glucose challenge   Review of Systems:   Review of Systems  Objective:    Obstetric History OB History  Gravida Para Term Preterm AB Living  2 0 0 0 1 0  SAB IAB Ectopic Multiple Live Births  0 0 1 0 0    # Outcome Date GA Lbr Len/2nd Weight Sex Delivery Anes PTL Lv  2 Current           1 Ectopic 08/2020            Past Medical History:  Diagnosis Date   Anxiety    Inverted nipple    Motion sickness    cars   [place]actinoma [place]    Past Surgical History:  Procedure Laterality Date   ANKLE SURGERY     GANGLION CYST EXCISION Left 11/24/2017   Procedure: REMOVAL GANGLION CYST ANKLE;  Surgeon: PCorky Mull MD;  [place] [facility]  Service: Orthopedics;  Laterality: Left;   KNEE ARTHROSCOPY WITH MENISCAL REPAIR Left 11/14/2015   Procedure: KNEE ARTHROSCOPY WITH partial synovectomy;  Surgeon: JCorky Mull MD;  Location: [facility];  Service: Orthopedics;  Laterality: Left;    Current Outpatient Medications on File Prior to Visit  Medication Sig Dispense Refill   Blood Pressure Monitoring DEVI 1 each by Does not apply route once a week. 1 each 0   hydrOXYzine (ATARAX) 10 MG tablet Take 10 mg by mouth 3 (three) times daily.     ondansetron (ZOFRAN) 4 MG tablet Take 1 tablet (4 mg total) by mouth every 8 (eight)  hours as needed for nausea or vomiting. 20 tablet 0   Prenatal Vit-Fe Fumarate-FA (MULTIVITAMIN-PRENATAL) 27-0.8 MG TABS tablet Take 1 tablet by mouth daily at 12 noon.     terconazole (TERAZOL 7) 0.4 % vaginal cream Place 1 applicator vaginally at bedtime. Use for seven days 45 g 0   sertraline (ZOLOFT) 25 MG tablet Take 1-2 tablets (25-50 mg  total) by mouth daily. Start 25 mg (1 tablet) once a day for 7 days, then increase to 50 mg (2 tablets) per day. (Patient not taking: Reported on 12/03/2022) 60 tablet 6   [DISCONTINUED] pantoprazole (PROTONIX) 20 MG tablet Take 1 tablet (20 mg total) by mouth daily. 30 tablet 1   No current facility-administered medications on file prior to visit.    Allergies  Allergen Reactions   Strawberry (Diagnostic) Hives and Shortness Of Breath    Social History:  reports that she has never smoked. She has never used smokeless tobacco. She reports that she does not drink alcohol and does not use drugs.  Family History  Problem Relation Age of Onset   Breast cancer Maternal Aunt     The following portions of the patient's history were reviewed and updated as appropriate: allergies, current medications, past family history, past medical history, past social history, past surgical history and problem list.  Review of Systems Review of Systems  Constitutional:  Negative for chills and fever.  HENT:  Negative for congestion and sore throat.   Eyes:  Negative for pain and visual disturbance.  Respiratory:  Negative for cough, chest tightness and shortness of breath.   Cardiovascular:  Negative for chest pain.  Gastrointestinal:  Negative for abdominal pain, diarrhea, nausea and vomiting.  Endocrine: Negative for cold intolerance and heat intolerance.  Genitourinary:  Negative for dysuria and flank pain.  Musculoskeletal:  Negative for back pain.  Skin:  Negative for rash.  Allergic/Immunologic: Negative for food allergies.  Neurological:  Negative for dizziness and light-headedness.  Psychiatric/Behavioral:  Negative for agitation.       Physical Exam:  BP 136/75   Pulse (!) 147   Wt 143 lb 1.6 oz (64.9 kg)   LMP 09/02/2022 (Exact Date)   BMI 24.56 kg/m  CONSTITUTIONAL: Well-developed, well-nourished female in no acute distress.  HENT:  Normocephalic, atraumatic, External right and left  ear normal. Oropharynx is clear and moist EYES: Conjunctivae normal. No scleral icterus.  NECK: Normal range of motion, supple, no masses.  Normal thyroid.  SKIN: Skin is warm and dry. No rash noted. Not diaphoretic. No erythema. No pallor. MUSCULOSKELETAL: Normal range of motion. No tenderness.  No cyanosis, clubbing, or edema.   NEUROLOGIC: Alert and oriented to person, place, and time. Normal muscle tone coordination.  PSYCHIATRIC: Normal mood and affect. Normal behavior. Normal judgment and thought content. CARDIOVASCULAR: Normal heart rate noted, regular rhythm RESPIRATORY: Clear to auscultation bilaterally. Effort and breath sounds normal, no problems with respiration noted. ABDOMEN: Soft, normal bowel sounds, no distention noted.  No tenderness, rebound or guarding. Fundal ht: 14 PELVIC: Normal appearing external genitalia; normal appearing vaginal mucosa and cervix.  No abnormal discharge noted.  Pap smear obtained.  Normal uterine size, no other palpable masses, no uterine or adnexal tenderness. FHR: 160   Assessment:  G2P0010   1. Supervision of other normal pregnancy, antepartum - CBC/D/Plt+RPR+Rh+ABO+RubIgG... - [place] MFM OB DETAIL +14 WK; Future - Hemoglobin A1c - Culture, OB Urine - CHL AMB BABYSCRIPTS SCHEDULE OPTIMIZATION - PANORAMA PRENATAL TEST FULL PANEL - HORIZON CUSTOM -  Cytology - PAP( [place]) - Flu Vaccine QUAD 29moIM (Fluarix, Fluzone & Alfiuria Quad PF) - ondansetron (ZOFRAN-ODT) 4 MG disintegrating tablet; Take 1 tablet (4 mg total) by mouth every 6 (six) hours as needed for nausea.  Dispense: 30 tablet; Refill: 1 - Cervic[place]aginal ancillary only( [place]  2. Anxiety Encouraged to start zoloft and hydroxyzine Reviewed use of hydroxyzine  Has IBH visit      Plan:    Initial labs drawn. Prenatal vitamins. Problem list reviewed and updated. Reviewed in detail the nature of the practice with collaborative care between  Genetic screening  discussed: NIPS ordered. Role of ultrasound in pregnancy discussed; Anatomy [place] ordered. Amniocentesis discussed: not indicated. Follow up in 4 weeks. Discussed clinic routines, schedule of care and testing, genetic screening options, involvement of students and residents under the direct supervision of APPs and doctors and presence of female providers. Pt verbalized understanding.  Future Appointments  Date Time Provider [place] 12/14/2022  1:[facility] 01/12/2023  1:00 PM [facility] US1 [facility] [place]   NCaren Macadam MD 12/09/2022 2:48 PM

## 2022-12-10 ENCOUNTER — Encounter: Payer: Self-pay | Admitting: Family Medicine

## 2022-12-10 LAB — CBC/D/PLT+RPR+RH+ABO+RUBIGG...
Antibody Screen: NEGATIVE
Basophils Absolute: 0 10*3/uL (ref 0.0–0.2)
Basos: 1 %
EOS (ABSOLUTE): 0 10*3/uL (ref 0.0–0.4)
Eos: 0 %
HCV Ab: NONREACTIVE
HIV Screen 4th Generation wRfx: NONREACTIVE
Hematocrit: 36.6 % (ref 34.0–46.6)
Hemoglobin: 12.2 g/dL (ref 11.1–15.9)
Hepatitis B Surface Ag: NEGATIVE
Immature Grans (Abs): 0 10*3/uL (ref 0.0–0.1)
Immature Granulocytes: 0 %
Lymphocytes Absolute: 1.4 10*3/uL (ref 0.7–3.1)
Lymphs: 23 %
MCH: 29.8 pg (ref 26.6–33.0)
MCHC: 33.3 g/dL (ref 31.5–35.7)
MCV: 89 fL (ref 79–97)
Monocytes Absolute: 0.4 10*3/uL (ref 0.1–0.9)
Monocytes: 6 %
Neutrophils Absolute: 4.2 10*3/uL (ref 1.4–7.0)
Neutrophils: 70 %
Platelets: 272 10*3/uL (ref 150–450)
RBC: 4.1 x10E6/uL (ref 3.77–5.28)
RDW: 12.5 % (ref 11.7–15.4)
RPR Ser Ql: NONREACTIVE
Rh Factor: POSITIVE
Rubella Antibodies, IGG: 4.23 index (ref >0.99)
WBC: 6 10*3/uL (ref 3.4–10.8)

## 2022-12-10 LAB — CERVICOVAGINAL ANCILLARY ONLY
Bacterial Vaginitis (gardnerella): NEGATIVE
Candida Glabrata: NEGATIVE
Candida Vaginitis: POSITIVE — AB
Chlamydia: NEGATIVE
Comment: NEGATIVE
Comment: NEGATIVE
Comment: NEGATIVE
Comment: NEGATIVE
Comment: NEGATIVE
Comment: NORMAL
Neisseria Gonorrhea: NEGATIVE
Trichomonas: NEGATIVE

## 2022-12-10 LAB — HEMOGLOBIN A1C
Est. average glucose Bld gHb Est-mCnc: 100 mg/dL
Hgb A1c MFr Bld: 5.1 % (ref 4.8–5.6)

## 2022-12-10 LAB — HCV INTERPRETATION

## 2022-12-11 ENCOUNTER — Encounter: Payer: Medicaid Other | Admitting: Advanced Practice Midwife

## 2022-12-11 LAB — URINE CULTURE, OB REFLEX

## 2022-12-11 LAB — CULTURE, OB URINE

## 2022-12-14 ENCOUNTER — Ambulatory Visit (INDEPENDENT_AMBULATORY_CARE_PROVIDER_SITE_OTHER): Payer: Medicaid Other | Admitting: Clinical

## 2022-12-14 ENCOUNTER — Encounter: Payer: Self-pay | Admitting: *Deleted

## 2022-12-14 DIAGNOSIS — F411 Generalized anxiety disorder: Secondary | ICD-10-CM

## 2022-12-14 NOTE — Patient Instructions (Signed)
Center for [facility] at [facility] [place], [place] [address] [phone] (main office) [phone] ([place] office)  Options for [place] in the [place]  As you review your birthing options, consider having a birth doula. A doula is trained to provide support before, during and just after you give birth. There are also postpartum doulas that help you adjust to new parenthood.  While doulas do not provide medical care, they do provide emotional, physical and educational support. A few months before your baby arrives, doulas can help answer questions, ease concerns and help you create and support your birthing plan.    Doulas can help reduce your stress and comfort you and your partner. They can help you cope with labor by helping you use breathing techniques, massage, creative labor positioning, essential oils and affirmations.   Studies show that the benefits of having a doula include:   A more positive birth experience  Fewer requests for pain-relief medication  Less likelihood of cesarean section, commonly called a c-section   Doulas are typically hired via a Charity fundraiser between you and the doula. We are happy to provide a list of the most active doulas in the area, all of whom are credentialed by [facility] and will not count as a visitor at your birth.  There are several options for no-cost doula care at our hospital, including:  [employer] A Cure 4 Moms Doula Study  For more information on these programs or to receive a list of doulas active in our area, please email [email or web address]

## 2022-12-16 LAB — CYTOLOGY - PAP
Chlamydia: NEGATIVE
Comment: NEGATIVE
Comment: NORMAL
Diagnosis: NEGATIVE
Neisseria Gonorrhea: NEGATIVE

## 2022-12-17 ENCOUNTER — Encounter: Payer: Self-pay | Admitting: Family Medicine

## 2022-12-17 ENCOUNTER — Other Ambulatory Visit: Payer: Self-pay

## 2022-12-17 MED ORDER — PRENATAL 27-0.8 MG PO TABS: 1.0000 | ORAL_TABLET | Freq: Every day | ORAL | 11 refills | Status: DC

## 2022-12-17 MED ORDER — HYDROXYZINE HCL 10 MG PO TABS: 10.0000 mg | ORAL_TABLET | Freq: Three times a day (TID) | ORAL | 1 refills | Status: DC

## 2022-12-17 MED ORDER — PRENATAL 27-0.8 MG PO TABS: 1.0000 | ORAL_TABLET | Freq: Every day | ORAL | 11 refills | Status: AC

## 2022-12-17 MED ORDER — HYDROXYZINE HCL 10 MG PO TABS: 10.0000 mg | ORAL_TABLET | Freq: Three times a day (TID) | ORAL | 1 refills | Status: AC

## 2022-12-18 LAB — PANORAMA PRENATAL TEST FULL PANEL:PANORAMA TEST PLUS 5 ADDITIONAL MICRODELETIONS: FETAL FRACTION: 8.3

## 2022-12-21 LAB — HORIZON CUSTOM: REPORT SUMMARY: NEGATIVE

## 2022-12-30 NOTE — BH Specialist Note (Signed)
Integrated Behavioral Health via Telemedicine Visit  01/13/2023 Ana Moran [id number]  Number of Integrated Behavioral Health Clinician visits: 3- Third Visit  Session Start time: [id number]   Session End time: [id number]  Total time in minutes: 31   Referring Provider: Manya Moran, CNM Patient/Family location: [facility] [place] [facility]  [facility] Provider location: [facility] [employer] [facility]  All persons participating in visit: Patient Ana Moran and Ana Moran   Types of Service: Individual psychotherapy and Video visit  I connected with Ana Moran and/or Ana Moran's  n/a  via  Telephone or Video Enabled Telemedicine Application  (Video is Caregility application) and verified that I am speaking with the correct person using two identifiers. Discussed confidentiality: Yes   I discussed the limitations of telemedicine and the availability of in person appointments.  Discussed there is a possibility of technology failure and discussed alternative modes of communication if that failure occurs.  I discussed that engaging in this telemedicine visit, they consent to the provision of behavioral healthcare and the services will be billed under their insurance.  Patient and/or legal guardian expressed understanding and consented to Telemedicine visit: Yes   Presenting Concerns: Patient and/or family reports the following symptoms/concerns: Worries about anxiety raising blood pressure; attributes increase in anxiety to the trauma of having ultrasound in previous pregnancy, where she was immediately rushed into emergency surgery due to ruptured ectopic pregnancy. Pt's blood pressure stays level when at home; elevates in the medical setting. Pt aims to find ob/gyn practice in [place] who will treat her with the respect and understanding she received here in [place]; family very  supportive in [place]. Pt not currently taking BH medication; nausea has gone down considerably.  Duration of problem: Current pregnancy; Severity of problem: moderate  Patient and/or Family's Strengths/Protective Factors: Social connections, Concrete supports in place (healthy food, safe environments, etc.), and Sense of purpose  Goals Addressed: Patient will:  Reduce symptoms of: anxiety   Increase knowledge and/or ability of: self-management skills   Demonstrate ability to: Increase healthy adjustment to current life circumstances and Increase adequate support systems for patient/family  Progress towards Goals: Ongoing  Interventions: Interventions utilized:  Mindfulness or Psychologist, educational, Psychoeducation and/or Health Education, and Link to [employer] Standardized Assessments completed: Not Needed  Patient and/or Family Response: Patient agrees with treatment plan.   Assessment: Patient currently experiencing Generalized anxiety disorder.   Patient may benefit from continued therapeutic interventions.  Plan: Follow up with behavioral health clinician on : Call Ana Moran at [phone], as needed. Behavioral recommendations:  -Continue plan to move to [place] at the end of this week; plan to switch to a new ob/gyn as soon as possible after the move -Continue using relaxation breathing exercise as needed; consider implementing grounding strategy (5,4,3,2,1) as discussed, prior to medical appointments -Continue plan to apply for WIC, EBT, Medicaid, all in new [place] as soon as able after the move -Read through information on After Visit Summary; use as needed Referral(s): [facility] (In Clinic) and [employer]:  new mom support  I discussed the assessment and treatment plan with the patient and/or parent/guardian. They were provided an opportunity to ask questions and all were answered. They agreed with the plan and demonstrated an  understanding of the instructions.   They were advised to call back or seek an in-person evaluation if the symptoms worsen or if the condition fails to  improve as anticipated.  Ana Hamman [name], LCSW     12/09/2022    4:32 PM 11/19/2022    3:19 PM  Depression screen PHQ 2/9  Decreased Interest 2 2  Down, Depressed, Hopeless 1 3  PHQ - 2 Score 3 5  Altered sleeping 2 0  Tired, decreased energy 2 3  Change in appetite 1 3  Feeling bad or failure about yourself  1 2  Trouble concentrating 0 0  Moving slowly or fidgety/restless 0 0  Suicidal thoughts 0 0  PHQ-9 Score 9 13      12/09/2022    4:32 PM 11/19/2022    3:24 PM  GAD 7 : Generalized Anxiety Score  Nervous, Anxious, on Edge 2 1  Control/stop worrying 1 2  Worry too much - different things 1 2  Trouble relaxing 0 0  Restless 0 0  Easily annoyed or irritable 1 2  Afraid - awful might happen 0 3  Total GAD 7 Score 5 10

## 2022-12-31 ENCOUNTER — Encounter: Payer: Self-pay | Admitting: Family Medicine

## 2023-01-05 ENCOUNTER — Encounter: Payer: Medicaid Other | Admitting: Family Medicine

## 2023-01-11 ENCOUNTER — Other Ambulatory Visit: Payer: Self-pay

## 2023-01-11 ENCOUNTER — Ambulatory Visit (INDEPENDENT_AMBULATORY_CARE_PROVIDER_SITE_OTHER): Payer: Medicaid Other | Admitting: Family Medicine

## 2023-01-11 ENCOUNTER — Encounter: Payer: Self-pay | Admitting: Family Medicine

## 2023-01-11 VITALS — BP 144/83 | HR 163 | Wt 147.4 lb

## 2023-01-11 DIAGNOSIS — Z3480 Encounter for supervision of other normal pregnancy, unspecified trimester: Secondary | ICD-10-CM | POA: Diagnosis not present

## 2023-01-11 NOTE — Progress Notes (Signed)
   PRENATAL VISIT NOTE  Subjective:  Ana Moran is a 27 y.o. G2P0010 at 28w5dbeing seen today for ongoing prenatal care.  She is currently monitored for the following issues for this low-risk pregnancy and has Paroxysmal tachycardia (HStephens; Pituitary microadenoma with hyperprolactinemia (HVernon; Supervision of other normal pregnancy, antepartum; and Anxiety on their problem list.  Patient reports no complaints.  Contractions: Not present. Vag. Bleeding: None.  Movement: Present. Denies leaking of fluid.   The following portions of the patient's history were reviewed and updated as appropriate: allergies, current medications, past family history, past medical history, past social history, past surgical history and problem list.   Objective:   Vitals:   01/11/23 1605 01/11/23 1611  BP: (!) 141/83 (!) 144/83  Pulse: (!) 132 (!) 163  Weight: 147 lb 6.4 oz (66.9 kg) 147 lb 6.4 oz (66.9 kg)    Fetal Status: Fetal Heart Rate (bpm): 158 Fundal Height: 18 cm Movement: Present     General:  Alert, oriented and cooperative. Patient is in no acute distress.  Skin: Skin is warm and dry. No rash noted.   Cardiovascular: Normal heart rate noted  Respiratory: Normal respiratory effort, no problems with respiration noted  Abdomen: Soft, gravid, appropriate for gestational age.  Pain/Pressure: Present (lower abdomen, lower back)     Pelvic: Cervical exam deferred        Extremities: Normal range of motion.  Edema: None  Mental Status: Normal mood and affect. Normal behavior. Normal judgment and thought content.   Assessment and Plan:  Pregnancy: G2P0010 at 132w5d1. Supervision of other normal pregnancy, antepartum TWG=7 lb 6.4 oz (3.357 kg)  Counseled on her very appropriate weight gain.  She is checking her BP at home and it is WNL there. She is very nervous here. She is going to send usKorean image of her log.  [place] Patient [facility] Preterm labor symptoms and general obstetric precautions including but not limited to vaginal bleeding, contractions, leaking of fluid and fetal movement were reviewed in detail with the patient. Please refer to After Visit Summary for other counseling recommendations.   Return in about 4 weeks (around 02/08/2023) for Mom+Baby Combined Care.  Future Appointments  Date Time Provider DeBol[place]ar2/20/2024  1:00 PM [facility] US1 [facility]/21/2024  1:15 PM [facility]3/10/2023  2:35 PM EcClarnce FlockMD [facility]  KiCaren MacadamMD

## 2023-01-12 ENCOUNTER — Ambulatory Visit: Payer: Medicaid Other | Attending: Family Medicine

## 2023-01-12 DIAGNOSIS — Z3480 Encounter for supervision of other normal pregnancy, unspecified trimester: Secondary | ICD-10-CM | POA: Diagnosis not present

## 2023-01-12 DIAGNOSIS — O99282 Endocrine, nutritional and metabolic diseases complicating pregnancy, second trimester: Secondary | ICD-10-CM | POA: Insufficient documentation

## 2023-01-12 DIAGNOSIS — E221 Hyperprolactinemia: Secondary | ICD-10-CM | POA: Insufficient documentation

## 2023-01-12 DIAGNOSIS — O99412 Diseases of the circulatory system complicating pregnancy, second trimester: Secondary | ICD-10-CM | POA: Diagnosis not present

## 2023-01-12 DIAGNOSIS — Z363 Encounter for antenatal screening for malformations: Secondary | ICD-10-CM | POA: Insufficient documentation

## 2023-01-12 DIAGNOSIS — Z3A18 18 weeks gestation of pregnancy: Secondary | ICD-10-CM | POA: Diagnosis not present

## 2023-01-12 DIAGNOSIS — I479 Paroxysmal tachycardia, unspecified: Secondary | ICD-10-CM | POA: Insufficient documentation

## 2023-01-13 ENCOUNTER — Ambulatory Visit (INDEPENDENT_AMBULATORY_CARE_PROVIDER_SITE_OTHER): Payer: Medicaid Other | Admitting: Clinical

## 2023-01-13 DIAGNOSIS — F411 Generalized anxiety disorder: Secondary | ICD-10-CM

## 2023-01-13 NOTE — Patient Instructions (Addendum)
Center for [facility] at [facility] [place],  [phone] (main office) [phone] [facility] [place] office)  New Parent Support Groups  www.postpartum.net  Grounding Strategy 5 Things I See 4 Things I Hear 3 Things I Feel 2 Things I Smell 1 Thing I Taste    BRAINSTORMING  Develop a Plan Goals: Provide a way to start conversation about your new life with a baby Assist parents in recognizing and using resources within their reach Help pave the way before birth for an easier period of transition afterwards.  Make a list of the following information to keep in a central location: Full name of Mom and Partner: _____________________________________________ 14 full name and Date of Birth: ___________________________________________ Home Address: ___________________________________________________________ ________________________________________________________________________ Home Phone: ____________________________________________________________ Parents' cell numbers: _____________________________________________________ ________________________________________________________________________ Name and contact info for OB: ______________________________________________ Name and contact info for Pediatrician:________________________________________ Contact info for Lactation Consultants: ________________________________________  REST and SLEEP *You each need at least 4-5 hours of uninterrupted sleep every day. Write specific names and contact information.* How are you going to rest in the postpartum period? While partner's home? When partner returns to work? When you both return to work? Where will your baby sleep? Who is available to help during the day? Evening? Night? Who could move in for a period to help support you? What are some ideas to help you get enough  sleep? __________________________________________________________________________________________________________________________________________________________________________________________________________________________________________ NUTRITIOUS FOOD AND DRINK *Plan for meals before your baby is born so you can have healthy food to eat during the immediate postpartum period.* Who will look after breakfast? Lunch? Dinner? List names and contact information. Brainstorm quick, healthy ideas for each meal. What can you do before baby is born to prepare meals for the postpartum period? How can others help you with meals? Which grocery stores provide online shopping and delivery? Which restaurants offer take-out or delivery options? ______________________________________________________________________________________________________________________________________________________________________________________________________________________________________________________________________________________________________________________________________________________________________________________________________  CARE FOR MOM *It's important that mom is cared for and pampered in the postpartum period. Remember, the most important ways new mothers need care are: sleep, nutrition, gentle exercise, and time off.* Who can come take care of mom during this period? Make a list of people with their contact information. List some activities that make you feel cared for, rested, and energized? Who can make sure you have opportunities to do these things? Does mom have a space of her very own within your home that's just for her? Make a "[facility]" where she can be comfortable, rest, and renew herself  daily. ______________________________________________________________________________________________________________________________________________________________________________________________________________________________________________________________________________________________________________________________________________________________________________________________________    CARE FOR AND FEEDING BABY *Knowledgeable and encouraging people will offer the best support with regard to feeding your baby.* Educate yourself and choose the best feeding option for your baby. Make a list of people who will guide, support, and be a resource for you as your care for and feed your baby. (Friends that have breastfed or are currently breastfeeding, lactation consultants, breastfeeding support groups, etc.) Consider a postpartum doula. (These websites can give you information: dona.org & BuyingShow.es) Seek out local breastfeeding resources like the breastfeeding support group at [employer] or [employer]. ______________________________________________________________________________________________________________________________________________________________________________________________________________________________________________________________________________________________________________________________________________________________________________________________________  Verner Chol AND ERRANDS Who can help with a thorough cleaning before baby is born? Make a list of people who will help with housekeeping and chores, like laundry, light cleaning, dishes, bathrooms, etc. Who can run some errands for you? What can you do to make sure you are stocked with basic supplies before baby is born? Who is going to do the  shopping? ______________________________________________________________________________________________________________________________________________________________________________________________________________________________________________________________________________________________________________________________________________________________________________________________________  Family Adjustment *Nurture yourselves.it helps parents be more loving and allows for better bonding with their child.* What sorts of things do you and partner enjoy doing together? Which activities help you to connect and strengthen your relationship? Make a list of those things. Make a list of people whom you trust to care for your baby so you can have some time together as a couple. What types of things help partner feel connected to Mom? Make a list. What needs will partner have in order to bond with baby? Other children? Who will care for them when you go into labor and while you are in the hospital? Think about what the needs of your older children might be. Who can help you meet those needs? In what ways are you helping them prepare for bringing baby home? List some specific strategies you have for family adjustment. _______________________________________________________________________________________________________________________________________________________________________________________________________________________________________________________________________________________________________________________________________________  SUPPORT *Someone who can empathize with experiences normalizes your problems and makes them more bearable.* Make a list of other friends, neighbors, and/or co-workers you know with infants (and small children, if applicable) with whom you can connect. Make a list of local or online support groups, mom groups, etc. in which you can be  involved. ______________________________________________________________________________________________________________________________________________________________________________________________________________________________________________________________________________________________________________________________________________________________________________________________________  Childcare Plans Investigate and plan for childcare if mom is returning to work. Talk about mom's concerns about her transition back to work. Talk about partner's concerns regarding this transition.  Mental Health *Your mental health is one of the highest priorities for a pregnant or postpartum mom.* 1 in 5 women experience anxiety and/or depression from the time of conception through the first year after birth. Postpartum Mood Disorders are the #1 complication of pregnancy and childbirth and the suffering experienced by these mothers is not necessary! These illnesses are temporary and respond well to treatment, which often includes self-care, social support, talk therapy, and medication when needed. Women experiencing anxiety and depression often say things like: "I'm supposed to be happy.why do I feel so sad?", "Why can't I snap out of it?", "I'm having thoughts that scare me." There is no need to be embarrassed if you are feeling these symptoms: Overwhelmed, anxious, angry, sad, guilty, irritable, hopeless, exhausted but can't sleep You are NOT alone. You are NOT to blame. With help, you WILL be well. Where can I find help? Medical professionals such as your OB, midwife, gynecologist, family practitioner, primary care provider, pediatrician, or mental health providers; [facility] support groups: Feelings After Birth, Breastfeeding Support Group, Baby and Me Group, and Fit 4 Two exercise classes. You have permission to ask for help. It will confirm your feelings, validate your experiences,  share/learn coping strategies, and gain support and encouragement as you heal. You are important! BRAINSTORM Make a list of local resources, including resources for mom and for partner. Identify support groups. Identify people to call late at night - include names and contact info. Talk with partner about perinatal mood and anxiety disorders. Talk with your OB, midwife, and doula about baby blues and about perinatal mood and anxiety disorders. Talk with your pediatrician about perinatal mood and anxiety disorders.   Support & Sanity Savers   What do you really need?  Basics In preparing for a new baby, many expectant parents spend hours shopping for baby clothes, decorating the nursery, and deciding which car seat to buy. Yet most don't think much about what the reality of parenting a newborn will be like, and what they need to make it through that. So, here is the advice of  experienced parents. We know you'll read this, and think "they're exaggerating, I don't really need that." Just trust [place] on these, OK? Plan for all of this, and if it turns out you don't need it, come back and teach [place] how you did it!  Must-Haves (Once baby's survival needs are met, make sure you attend to your own survival needs!) Sleep An average newborn sleeps 16-18 hours per day, over 6-7 sleep periods, rarely more than three hours at a time. It is normal and healthy for a newborn to wake throughout the night... but really hard on parents!! Naps. Prioritize sleep above any responsibilities like: cleaning house, visiting friends, running errands, etc.  Sleep whenever baby sleeps. If you can't nap, at least have restful times when baby eats. The more rest you get, the more patient you will be, the more emotionally stable, and better at solving problems.  Food You may not have realized it would be difficult to eat when you have a newborn. Yet, when we talk to countless new parents, they say things like "it may be 2:00 pm  when I realize I haven't had breakfast yet." Or "every time we sit down to dinner, baby needs to eat, and my food gets cold, so I don't bother to eat it." Finger food. Before your baby is born, stock up with one months' worth of food that: 1) you can eat with one hand while holding a baby, 2) doesn't need to be prepped, 3) is good hot or cold, 4) doesn't spoil when left out for a few hours, and 5) you like to eat. Think about: nuts, dried fruit, Clif bars, pretzels, jerky, gogurt, baby carrots, apples, bananas, crackers, cheez-n-crackers, string cheese, hot pockets or frozen burritos to microwave, garden burgers and breakfast pastries to put in the toaster, yogurt drinks, etc. Restaurant Menus. Make lists of your favorite restaurants & menu items. When family/friends want to help, you can give specific information without much thought. They can either bring you the food or send gift cards for just the right meals. Freezer Meals.  Take some time to make a few meals to put in the freezer ahead of time.  Easy to freeze meals can be anything such as soup, lasagna, chicken pie, or spaghetti sauce. Set up a Meal Schedule.  Ask friends and family to sign up to bring you meals during the first few weeks of being home. (It can be passed around at baby showers!) You have no idea how helpful this will be until you are in the throes of parenting.  https://hamilton-woodard.com/ is a great website to check out. Emotional Support Know who to call when you're stressed out. Parenting a newborn is very challenging work. There are times when it totally overwhelms your normal coping abilities. EVERY NEW PARENT NEEDS TO HAVE A PLAN FOR WHO TO CALL WHEN THEY JUST CAN'T COPE ANY MORE. (And it has to be someone other than the baby's other parent!) Before your baby is born, come up with at least one person you can call for support - write their phone number down and post it on the refrigerator. Anxiety & Sadness. Baby blues are normal after  pregnancy; however, there are more severe types of anxiety & sadness which can occur and should not be ignored.  They are always treatable, but you have to take the first step by reaching out for help. [facility] offers a "Mom Talk" group which meets every Tuesday from 10 am - 11 am.  This group is for new moms who need support and connection after their babies are born.  Call [phone].  Really, Really Helpful (Plan for them! Make sure these happen often!!) Physical Support with Taking Care of Yourselves Asking friends and family. Before your baby is born, set up a schedule of people who can come and visit and help out (or ask a friend to schedule for you). Any time someone says "let me know what I can do to help," sign them up for a day. When they get there, their job is not to take care of the baby (that's your job and your joy). Their job is to take care of you!  Postpartum doulas. If you don't have anyone you can call on for support, look into postpartum doulas:  professionals at helping parents with caring for baby, caring for themselves, getting breastfeeding started, and helping with household tasks. www.padanc.org is a helpful website for learning about doulas in our area. Peer Support / Parent Groups Why: One of the greatest ideas for new parents is to be around other new parents. Parent groups give you a chance to share and listen to others who are going through the same season of life, get a sense of what is normal infant development by watching several babies learn and grow, share your stories of triumph and struggles with empathetic ears, and forgive your own mistakes when you realize all parents are learning by trial and error. Where to find: There are many places you can meet other new parents throughout our community.  [facility] offers the following classes for new moms and their little ones:  Baby and Me (Birth to [place]) and Breastfeeding Support Group. Go to  www.conehealthybaby.com or call [phone] for more information. Time for your Relationship It's easy to get so caught up in meeting baby's immediate needs that it's hard to find time to connect with your partner, and meet the needs of your relationship. It's also easy to forget what "quality time with your partner" actually looks like. If you take your baby on a date, you'd be amazed how much of your couple time is spent feeding the baby, diapering the baby, admiring the baby, and talking about the baby. Dating: Try to take time for just the two of you. Babysitter tip: Sometimes when moms are breastfeeding a newborn, they find it hard to figure out how to schedule outings around baby's unpredictable feeding schedules. Have the babysitter come for a three hour period. When she comes over, if baby has just eaten, you can leave right away, and come back in two hours. If baby hasn't fed recently, you start the date at home. Once baby gets hungry and gets a good feeding in, you can head out for the rest of your date time. Date Nights at Home: If you can't get out, at least set aside one evening a week to prioritize your relationship: whenever baby dozes off or doesn't have any immediate needs, spend a little time focusing on each other. Potential conflicts: The main relationship conflicts that come up for new parents are: issues related to sexuality, financial stresses, a feeling of an unfair division of household tasks, and conflicts in parenting styles. The more you can work on these issues before baby arrives, the better!  Fun and Frills (Don't forget these. and don't feel guilty for indulging in them!) Everyone has something in life that is a fun little treat that they do just for themselves. It may be: reading  the morning paper, or going for a daily jog, or having coffee with a friend once a week, or going to a movie on Friday nights, or fine chocolates, or bubble baths, or curling up with a good  book. Unless you do fun things for yourself every now and then, it's hard to have the energy for fun with your baby. Whatever your "special" treats are, make sure you find a way to continue to indulge in them after your baby is born. These special moments can recharge you, and allow you to return to baby with a new joy   PERINATAL MOOD DISORDERS: [place]   _________________________________________Emergency and Crisis Resources If you are an imminent risk to self or others, are experiencing intense personal distress, and/or have noticed significant changes in activities of daily living, call:  [place]: [phone]  [address], [place], [place], [address] Mobile Crisis: [place]: [phone] Or visit the following crisis centers: Local Emergency Departments [facility]: [address] [place]. Hours: 8:30AM-5PM. Insurance Accepted: Medicaid, Medicare, and Uninsured.  [facility]:  [address], [place]  Mon-Friday 8am-3pm, [phone]                                                                                  ___________ Non-Crisis Resources To identify specific providers that are covered by your insurance, contact your insurance company or local agencies:  [place]: [phone] [facility] [employer]: [place]: [phone] Postpartum Support International- Warm-line: 325-195-0817                                                      __Outpatient Therapy and Medication Management   Providers:  [facility]: [phone] Hours: 9AM-5PM  Insurance Accepted: Alben Spittle, Shane Crutch, [place], [place] [facility]): [phone] Hours: 8AM-5:30PM  nsurance Accepted: All insurances EXCEPT AARP, [place],  [place], and [place]: [phone] Hours: 8AM-8PM Insurance Accepted: Cristal Ford, Freddrick March, [place], Medicare, [phone] [facility]: [phone] Hours: 8:30AM-7PM Insurance Accepted: Cristal Ford, Medicaid, Medicare, Tricare, [employer] Counseling:  [place] Accepted:  Holland Falling, Lorella Nimrod, [employer], [place]: [phone] Hours: 9AM-5:30PM Insurance Accepted: Alben Spittle, Charlotte Crumb, and Medicaid, Medicare, [facility]:  [phone] Hours: 9am-5pm Insurance Accepted: BCBS; they do not accept Medicaid/Medicare [facility] [place]: [phone] Hours: 9am-9pm Insurance Accepted: All major insurance including Medicaid and Medicare [facility]: [phone] Hours: [place] Accepted: All insurances EXCEPT Medicaid and Medicare. [facility]: [phone]   ____________  Parenting Support Groups [facility]: [phone] [facility]:  [phone] Family Support Network: (support for children in the NICU and/or with special needs), [phone]   ___________                                                                 Mental Health Support Groups Mental Health Association: [phone]    _____________                                                                                  Online Resources Postpartum Support International: http://www.postpartum.net/  800-944-4PPD 2Moms Supporting Moms:  www.momssupportingmoms.net    

## 2023-01-14 ENCOUNTER — Encounter: Payer: Self-pay | Admitting: Family Medicine

## 2023-02-02 ENCOUNTER — Encounter: Payer: Medicaid Other | Admitting: Family Medicine

## 2023-02-11 ENCOUNTER — Encounter: Payer: Self-pay | Admitting: Family Medicine

## 2024-01-28 ENCOUNTER — Emergency Department: Admit: 2024-01-28 | Payer: PRIVATE HEALTH INSURANCE

## 2024-01-28 ENCOUNTER — Inpatient Hospital Stay
Admit: 2024-01-28 | Discharge: 2024-01-28 | Disposition: A | Discharge status: Home / Self Care | Payer: PRIVATE HEALTH INSURANCE | Attending: Pediatric Emergency Medicine

## 2024-01-28 DIAGNOSIS — S80211A Abrasion, right knee, initial encounter: Secondary | ICD-10-CM

## 2024-01-28 DIAGNOSIS — M25561 Pain in right knee: Secondary | ICD-10-CM

## 2024-01-28 NOTE — Plan of Care (Signed)
 Triage: Mom fell down about 3 stairs holding son. Now c/o right KNEE pain.

## 2024-01-28 NOTE — ED Notes (Signed)
 Pt discharged home with parent/guardian.Pt acting age appropriately, respirations regular and unlabored, cap refill less than two seconds. Skin pink, dry and warm. Lungs clear bilaterally. No further complaints at this time. Parent/guardian verbalized understanding of discharge paperwork and has no further questions at this time.    Education provided about continuation of care, follow up care with PCP, return for worsening symptoms. Parent/guardian able to provided teach back about discharge instructions.

## 2024-01-28 NOTE — ED Provider Notes (Signed)
 [facility]  EMERGENCY DEPARTMENT ENCOUNTER      Pt Name: Darlene Mccarthy  MRN: [id number]  Birthdate 24-Mar-1996  Date of evaluation: 01/28/2024  Provider: Yisroel Ramming, PA-C    CHIEF COMPLAINT       Chief Complaint   Patient presents with    Knee Pain         HISTORY OF PRESENT ILLNESS   (Location/Symptom, Timing/Onset, Context/Setting, Quality, Duration, Modifying Factors, Severity)  Note limiting factors.   28 year old female with no significant past medical history presents with complaint of right knee pain.  Patient reports that she was walking down the garage stairs holding her infant when she fell due to one of the stairs being loose.  She reports falling forward and landing on her right side.  She had her baby in her arms and therefore did not reach out to brace herself.  She reports hitting the right knee on the ground.  Denies hitting her head.  No loss of consciousness.  She is not currently on any blood thinners.  She has been able to walk ever since then, she does have some discomfort on the anterior side of her right knee.  No medications taken prior to arrival.  No other complaints at this time.    The history is provided by the patient.         Review of External Medical Records:     Nursing Notes were reviewed.    REVIEW OF SYSTEMS    (2-9 systems for level 4, 10 or more for level 5)     Review of Systems    Except as noted above the remainder of the review of systems was reviewed and negative.       PAST MEDICAL HISTORY   History reviewed. No pertinent past medical history.      SURGICAL HISTORY       Past Surgical History:   Procedure Laterality Date    ANKLE SURGERY      ECTOPIC PREGNANCY SURGERY      KNEE SURGERY           CURRENT MEDICATIONS       Previous Medications    No medications on file       ALLERGIES     Strawberry    FAMILY HISTORY     History reviewed. No pertinent family history.       SOCIAL HISTORY       Social History     Socioeconomic  History    Marital status: Single     Spouse name: None    Number of children: None    Years of education: None    Highest education level: None           PHYSICAL EXAM    (up to 7 for level 4, 8 or more for level 5)     ED Triage Vitals   BP Systolic BP Percentile Diastolic BP Percentile Temp Temp src Pulse Resp SpO2   -- -- -- -- -- -- -- --      Height Weight         -- --             There is no height or weight on file to calculate BMI.    Physical Exam  Vitals and nursing note reviewed.   Constitutional:       General: She is not in acute distress.     Appearance: Normal appearance.  She is not ill-appearing.   HENT:      Head: Normocephalic and atraumatic.   Eyes:      Extraocular Movements: Extraocular movements intact.      Pupils: Pupils are equal, round, and reactive to light.   Cardiovascular:      Rate and Rhythm: Normal rate.      Pulses: Normal pulses.   Pulmonary:      Effort: Pulmonary effort is normal. No respiratory distress.   Musculoskeletal:         General: Tenderness present. Normal range of motion.      Cervical back: Normal range of motion and neck supple.      Right lower leg: No edema.      Left lower leg: No edema.      Comments: There is tenderness to palpation along the anterior right knee, over the patella.  There is a superficial abrasion noted to the patella.  There is no swelling or deformity.  Distal sensation intact.  Strong posterior tibial pulse.  There is no tenderness to palpation over all joints of bilateral upper extremities.  There is no tenderness over the right hip, lower leg, or ankle.  Left leg without tenderness.   Skin:     General: Skin is warm and dry.      Capillary Refill: Capillary refill takes less than 2 seconds.      Coloration: Skin is not pale.      Findings: No rash.   Neurological:      General: No focal deficit present.      Mental Status: She is alert and oriented to person, place, and time. Mental status is at baseline.         DIAGNOSTIC RESULTS      EKG: All EKG's are interpreted by the Emergency Department Physician who either signs or Co-signs this chart in the absence of a cardiologist.        RADIOLOGY:   Non-plain film images such as CT, Ultrasound and MRI are read by the radiologist. Plain radiographic images are visualized and preliminarily interpreted by the emergency physician with the below findings:        Interpretation per the Radiologist below, if available at the time of this note:    XR KNEE RIGHT (3 VIEWS)   Final Result   No acute bony abnormalities.         Electronically signed by Grier Rocher           LABS:  Labs Reviewed - No data to display    All other labs were within normal range or not returned as of this dictation.    EMERGENCY DEPARTMENT COURSE and DIFFERENTIAL DIAGNOSIS/MDM:   Vitals:    Vitals:    01/28/24 1015   BP: 118/70   Pulse: 70   Resp: 18   Temp: 98 F (36.7 C)   TempSrc: Tympanic   SpO2: 100%   Weight: 72.6 kg (160 lb)           Medical Decision Making  28 year old patient presents with complaint of right knee pain after a mechanical ground level fall.  Patient is well-appearing, nontoxic, and with normal vital signs.  Patient without any head injury.  On exam, patient with superficial abrasion noted to the anterior right knee with tenderness over the patella.  No swelling or deformity.  Neurovascularly intact.  Differential diagnosis includes, but is not limited to, fracture, dislocation, contusion, sprain.  X-ray of the knee was obtained  which was negative for any acute findings.  Patient was offered pain medication while in the ED, however she declined.  Given ice.    Patient is stable for discharge home at this time.  Suspect pain secondary to contusion/abrasion.  Discussed RICE.  Wound care and return precautions discussed with patient who expresses understanding and is in agreement with the current plan.  Recommend follow-up with PCP.    Amount and/or Complexity of Data Reviewed  Radiology: ordered.        ED  Course as of 01/28/24 1116   Fri Jan 28, 2024   1049 Independent interpretation of knee x-ray without acute osseous abnormalities.  No visible fracture or joint effusion.  Will await radiology read. [AF]      ED Course User Index  [AF] Yisroel Ramming, PA-C         FINAL IMPRESSION      1. Acute pain of right knee    2. Fall, initial encounter    3. Superficial abrasion          DISPOSITION/PLAN   DISPOSITION Decision To Discharge 01/28/2024 11:13:13 AM      PATIENT REFERRED TO:  [facility]  [address]  [place] [address]  [phone]    As needed, If symptoms worsen    Your PCP    In 2 days        DISCHARGE MEDICATIONS:  New Prescriptions    No medications on file         (Please note that portions of this note were completed with a voice recognition program.  Efforts were made to edit the dictations but occasionally words are mis-transcribed.)    Yisroel Ramming, PA-C (electronically signed)  Emergency Attending Physician / Physician Assistant / Nurse Practitioner             Yisroel Ramming, PA-C  01/28/24 1116

## 2024-01-29 NOTE — Lactation Note (Signed)
 This note was copied from a baby's chart.  Baby nursing well today,  deep latch obtained, mother is comfortable, baby feeding vigorously with rhythmic suck, swallow, breathe pattern, audible swallowing, and evident milk transfer, both breasts offered, baby is asleep following feeding.
